# Patient Record
Sex: Male | Born: 1960
Health system: Southern US, Community
[De-identification: ages and names within clinical notes are randomized; demographics above are authoritative.]

## PROBLEM LIST (undated history)

## (undated) DIAGNOSIS — M545 Low back pain, unspecified: Secondary | ICD-10-CM

## (undated) DIAGNOSIS — L03115 Cellulitis of right lower limb: Secondary | ICD-10-CM

## (undated) DIAGNOSIS — Z8489 Family history of other specified conditions: Secondary | ICD-10-CM

## (undated) DIAGNOSIS — Z9289 Personal history of other medical treatment: Secondary | ICD-10-CM

## (undated) DIAGNOSIS — I82409 Acute embolism and thrombosis of unspecified deep veins of unspecified lower extremity: Secondary | ICD-10-CM

## (undated) DIAGNOSIS — K219 Gastro-esophageal reflux disease without esophagitis: Secondary | ICD-10-CM

## (undated) DIAGNOSIS — G8929 Other chronic pain: Secondary | ICD-10-CM

## (undated) DIAGNOSIS — M1990 Unspecified osteoarthritis, unspecified site: Secondary | ICD-10-CM

## (undated) DIAGNOSIS — F419 Anxiety disorder, unspecified: Secondary | ICD-10-CM

## (undated) HISTORY — PX: EXCISIONAL TOTAL KNEE ARTHROPLASTY WITH ANTIBIOTIC SPACERS: SHX5827

## (undated) HISTORY — PX: APPENDECTOMY: SHX54

## (undated) HISTORY — PX: JOINT REPLACEMENT: SHX530

## (undated) HISTORY — PX: TUMOR EXCISION: SHX421

## (undated) HISTORY — PX: I & D KNEE WITH POLY EXCHANGE: SHX5024

## (undated) HISTORY — PX: TOTAL KNEE ARTHROPLASTY: SHX125

## (undated) HISTORY — PX: KNEE JOINT MANIPULATION: SHX386

---

## 2011-08-30 DIAGNOSIS — M25579 Pain in unspecified ankle and joints of unspecified foot: Secondary | ICD-10-CM | POA: Insufficient documentation

## 2012-01-19 HISTORY — PX: TOTAL KNEE ARTHROPLASTY: SHX125

## 2013-03-12 DIAGNOSIS — L409 Psoriasis, unspecified: Secondary | ICD-10-CM | POA: Insufficient documentation

## 2013-09-13 DIAGNOSIS — G4700 Insomnia, unspecified: Secondary | ICD-10-CM | POA: Insufficient documentation

## 2013-10-17 DIAGNOSIS — T8450XA Infection and inflammatory reaction due to unspecified internal joint prosthesis, initial encounter: Secondary | ICD-10-CM | POA: Insufficient documentation

## 2014-10-10 LAB — HM COLONOSCOPY

## 2015-01-30 DIAGNOSIS — M1990 Unspecified osteoarthritis, unspecified site: Secondary | ICD-10-CM | POA: Insufficient documentation

## 2015-01-30 DIAGNOSIS — E559 Vitamin D deficiency, unspecified: Secondary | ICD-10-CM | POA: Insufficient documentation

## 2015-01-30 DIAGNOSIS — F419 Anxiety disorder, unspecified: Secondary | ICD-10-CM | POA: Insufficient documentation

## 2015-01-30 DIAGNOSIS — K219 Gastro-esophageal reflux disease without esophagitis: Secondary | ICD-10-CM | POA: Insufficient documentation

## 2015-01-30 DIAGNOSIS — E291 Testicular hypofunction: Secondary | ICD-10-CM | POA: Insufficient documentation

## 2015-01-30 DIAGNOSIS — R030 Elevated blood-pressure reading, without diagnosis of hypertension: Secondary | ICD-10-CM | POA: Insufficient documentation

## 2015-01-30 DIAGNOSIS — E7800 Pure hypercholesterolemia, unspecified: Secondary | ICD-10-CM | POA: Insufficient documentation

## 2015-07-09 DIAGNOSIS — Z96653 Presence of artificial knee joint, bilateral: Secondary | ICD-10-CM | POA: Insufficient documentation

## 2015-11-12 DIAGNOSIS — G8929 Other chronic pain: Secondary | ICD-10-CM | POA: Insufficient documentation

## 2015-11-12 DIAGNOSIS — M545 Low back pain: Secondary | ICD-10-CM

## 2017-01-18 HISTORY — PX: EXCISIONAL TOTAL KNEE ARTHROPLASTY WITH ANTIBIOTIC SPACERS: SHX5827

## 2017-02-17 DIAGNOSIS — M47816 Spondylosis without myelopathy or radiculopathy, lumbar region: Secondary | ICD-10-CM | POA: Insufficient documentation

## 2017-02-23 DIAGNOSIS — T8459XA Infection and inflammatory reaction due to other internal joint prosthesis, initial encounter: Secondary | ICD-10-CM | POA: Insufficient documentation

## 2017-02-23 DIAGNOSIS — Z96659 Presence of unspecified artificial knee joint: Secondary | ICD-10-CM

## 2017-03-07 DIAGNOSIS — Z6841 Body Mass Index (BMI) 40.0 and over, adult: Secondary | ICD-10-CM

## 2017-03-07 DIAGNOSIS — Z79899 Other long term (current) drug therapy: Secondary | ICD-10-CM | POA: Insufficient documentation

## 2017-04-18 HISTORY — PX: KNEE FUSION: SUR623

## 2017-05-16 DIAGNOSIS — T84019A Broken internal joint prosthesis, unspecified site, initial encounter: Secondary | ICD-10-CM | POA: Insufficient documentation

## 2017-06-18 DIAGNOSIS — L03115 Cellulitis of right lower limb: Secondary | ICD-10-CM

## 2017-06-18 HISTORY — DX: Cellulitis of right lower limb: L03.115

## 2017-07-05 ENCOUNTER — Inpatient Hospital Stay (HOSPITAL_COMMUNITY)
Admission: EM | Admit: 2017-07-05 | Discharge: 2017-07-07 | DRG: 863 | Disposition: A | Discharge status: Home Health | Payer: Medicare Other | Attending: Family Medicine | Admitting: Family Medicine

## 2017-07-05 ENCOUNTER — Emergency Department (HOSPITAL_COMMUNITY)
Admit: 2017-07-05 | Discharge: 2017-07-05 | Disposition: A | Discharge status: Home / Self Care | Payer: Medicare Other | Attending: Emergency Medicine | Admitting: Emergency Medicine

## 2017-07-05 ENCOUNTER — Encounter (HOSPITAL_COMMUNITY): Payer: Self-pay | Admitting: *Deleted

## 2017-07-05 ENCOUNTER — Other Ambulatory Visit: Payer: Self-pay

## 2017-07-05 ENCOUNTER — Inpatient Hospital Stay (HOSPITAL_COMMUNITY): Payer: Medicare Other

## 2017-07-05 DIAGNOSIS — G8929 Other chronic pain: Secondary | ICD-10-CM | POA: Diagnosis present

## 2017-07-05 DIAGNOSIS — M79604 Pain in right leg: Secondary | ICD-10-CM | POA: Diagnosis not present

## 2017-07-05 DIAGNOSIS — Z881 Allergy status to other antibiotic agents status: Secondary | ICD-10-CM

## 2017-07-05 DIAGNOSIS — Y838 Other surgical procedures as the cause of abnormal reaction of the patient, or of later complication, without mention of misadventure at the time of the procedure: Secondary | ICD-10-CM | POA: Diagnosis present

## 2017-07-05 DIAGNOSIS — M7989 Other specified soft tissue disorders: Secondary | ICD-10-CM

## 2017-07-05 DIAGNOSIS — Z7901 Long term (current) use of anticoagulants: Secondary | ICD-10-CM

## 2017-07-05 DIAGNOSIS — R000 Tachycardia, unspecified: Secondary | ICD-10-CM | POA: Diagnosis present

## 2017-07-05 DIAGNOSIS — K219 Gastro-esophageal reflux disease without esophagitis: Secondary | ICD-10-CM | POA: Diagnosis present

## 2017-07-05 DIAGNOSIS — B999 Unspecified infectious disease: Secondary | ICD-10-CM

## 2017-07-05 DIAGNOSIS — Z6841 Body Mass Index (BMI) 40.0 and over, adult: Secondary | ICD-10-CM | POA: Diagnosis not present

## 2017-07-05 DIAGNOSIS — M79609 Pain in unspecified limb: Secondary | ICD-10-CM

## 2017-07-05 DIAGNOSIS — L03115 Cellulitis of right lower limb: Secondary | ICD-10-CM | POA: Diagnosis present

## 2017-07-05 DIAGNOSIS — Z96651 Presence of right artificial knee joint: Secondary | ICD-10-CM | POA: Diagnosis present

## 2017-07-05 DIAGNOSIS — T8140XA Infection following a procedure, unspecified, initial encounter: Secondary | ICD-10-CM | POA: Diagnosis not present

## 2017-07-05 DIAGNOSIS — L0390 Cellulitis, unspecified: Secondary | ICD-10-CM | POA: Diagnosis not present

## 2017-07-05 DIAGNOSIS — Z79899 Other long term (current) drug therapy: Secondary | ICD-10-CM | POA: Diagnosis not present

## 2017-07-05 DIAGNOSIS — Z86718 Personal history of other venous thrombosis and embolism: Secondary | ICD-10-CM | POA: Diagnosis not present

## 2017-07-05 DIAGNOSIS — M545 Low back pain: Secondary | ICD-10-CM | POA: Diagnosis present

## 2017-07-05 DIAGNOSIS — E669 Obesity, unspecified: Secondary | ICD-10-CM | POA: Diagnosis present

## 2017-07-05 DIAGNOSIS — F419 Anxiety disorder, unspecified: Secondary | ICD-10-CM | POA: Diagnosis present

## 2017-07-05 HISTORY — DX: Acute embolism and thrombosis of unspecified deep veins of unspecified lower extremity: I82.409

## 2017-07-05 LAB — COMPREHENSIVE METABOLIC PANEL
ALT: 14 U/L — ABNORMAL LOW (ref 17–63)
AST: 14 U/L — ABNORMAL LOW (ref 15–41)
Albumin: 3.5 g/dL (ref 3.5–5.0)
Alkaline Phosphatase: 92 U/L (ref 38–126)
Anion gap: 11 (ref 5–15)
BUN: 15 mg/dL (ref 6–20)
CHLORIDE: 102 mmol/L (ref 101–111)
CO2: 27 mmol/L (ref 22–32)
Calcium: 9.2 mg/dL (ref 8.9–10.3)
Creatinine, Ser: 1.12 mg/dL (ref 0.61–1.24)
Glucose, Bld: 110 mg/dL — ABNORMAL HIGH (ref 65–99)
POTASSIUM: 3.4 mmol/L — AB (ref 3.5–5.1)
SODIUM: 140 mmol/L (ref 135–145)
Total Bilirubin: 0.4 mg/dL (ref 0.3–1.2)
Total Protein: 7.7 g/dL (ref 6.5–8.1)

## 2017-07-05 LAB — CBC WITH DIFFERENTIAL/PLATELET
ABS IMMATURE GRANULOCYTES: 0.1 10*3/uL (ref 0.0–0.1)
Basophils Absolute: 0.1 10*3/uL (ref 0.0–0.1)
Basophils Relative: 1 %
Eosinophils Absolute: 0.3 10*3/uL (ref 0.0–0.7)
Eosinophils Relative: 3 %
HEMATOCRIT: 33.6 % — AB (ref 39.0–52.0)
HEMOGLOBIN: 10.4 g/dL — AB (ref 13.0–17.0)
IMMATURE GRANULOCYTES: 1 %
LYMPHS ABS: 1.6 10*3/uL (ref 0.7–4.0)
LYMPHS PCT: 15 %
MCH: 26.4 pg (ref 26.0–34.0)
MCHC: 31 g/dL (ref 30.0–36.0)
MCV: 85.3 fL (ref 78.0–100.0)
Monocytes Absolute: 1.5 10*3/uL — ABNORMAL HIGH (ref 0.1–1.0)
Monocytes Relative: 15 %
NEUTROS PCT: 67 %
Neutro Abs: 6.9 10*3/uL (ref 1.7–7.7)
Platelets: 318 10*3/uL (ref 150–400)
RBC: 3.94 MIL/uL — AB (ref 4.22–5.81)
RDW: 15.2 % (ref 11.5–15.5)
WBC: 10.3 10*3/uL (ref 4.0–10.5)

## 2017-07-05 LAB — PROTIME-INR
INR: 2.46
PROTHROMBIN TIME: 26.5 s — AB (ref 11.4–15.2)

## 2017-07-05 LAB — URINALYSIS, ROUTINE W REFLEX MICROSCOPIC
BILIRUBIN URINE: NEGATIVE
Glucose, UA: NEGATIVE mg/dL
KETONES UR: NEGATIVE mg/dL
LEUKOCYTES UA: NEGATIVE
NITRITE: NEGATIVE
PH: 5 (ref 5.0–8.0)
Protein, ur: 30 mg/dL — AB
Specific Gravity, Urine: 1.028 (ref 1.005–1.030)

## 2017-07-05 LAB — I-STAT CG4 LACTIC ACID, ED: LACTIC ACID, VENOUS: 1.5 mmol/L (ref 0.5–1.9)

## 2017-07-05 MED ORDER — CYCLOBENZAPRINE HCL 5 MG PO TABS
5.0000 mg | ORAL_TABLET | Freq: Three times a day (TID) | ORAL | Status: DC | PRN
  Administered 2017-07-05 – 2017-07-06 (×2): 5 mg via ORAL
  Filled 2017-07-05 (×2): qty 1

## 2017-07-05 MED ORDER — ACETAMINOPHEN 500 MG PO TABS
500.0000 mg | ORAL_TABLET | Freq: Four times a day (QID) | ORAL | Status: DC | PRN
Start: 2017-07-05 — End: 2017-07-07
  Administered 2017-07-06 (×2): 500 mg via ORAL
  Filled 2017-07-05 (×2): qty 1

## 2017-07-05 MED ORDER — VANCOMYCIN HCL IN DEXTROSE 1-5 GM/200ML-% IV SOLN
1000.0000 mg | Freq: Two times a day (BID) | INTRAVENOUS | Status: DC
  Filled 2017-07-05: qty 200

## 2017-07-05 MED ORDER — VANCOMYCIN HCL IN DEXTROSE 1-5 GM/200ML-% IV SOLN: 1000.0000 mg | Freq: Once | INTRAVENOUS | Status: DC

## 2017-07-05 MED ORDER — GABAPENTIN 300 MG PO CAPS
300.0000 mg | ORAL_CAPSULE | Freq: Three times a day (TID) | ORAL | Status: DC
  Administered 2017-07-05 – 2017-07-07 (×7): 300 mg via ORAL
  Filled 2017-07-05 (×8): qty 1

## 2017-07-05 MED ORDER — CLINDAMYCIN PHOSPHATE 600 MG/50ML IV SOLN
600.0000 mg | Freq: Once | INTRAVENOUS | Status: AC
  Administered 2017-07-05: 600 mg via INTRAVENOUS
  Filled 2017-07-05: qty 50

## 2017-07-05 MED ORDER — VANCOMYCIN HCL 10 G IV SOLR
1250.0000 mg | Freq: Two times a day (BID) | INTRAVENOUS | Status: DC
  Administered 2017-07-06 – 2017-07-07 (×3): 1250 mg via INTRAVENOUS
  Filled 2017-07-05 (×3): qty 1250

## 2017-07-05 MED ORDER — WARFARIN SODIUM 10 MG PO TABS
10.0000 mg | ORAL_TABLET | Freq: Once | ORAL | Status: AC
  Administered 2017-07-05: 10 mg via ORAL
  Filled 2017-07-05 (×2): qty 1

## 2017-07-05 MED ORDER — PANTOPRAZOLE SODIUM 40 MG PO TBEC
40.0000 mg | DELAYED_RELEASE_TABLET | Freq: Every day | ORAL | Status: DC
  Administered 2017-07-06 – 2017-07-07 (×2): 40 mg via ORAL
  Filled 2017-07-05 (×2): qty 1

## 2017-07-05 MED ORDER — ACETAMINOPHEN 500 MG PO TABS
500.0000 mg | ORAL_TABLET | Freq: Once | ORAL | Status: AC
  Administered 2017-07-05: 500 mg via ORAL
  Filled 2017-07-05: qty 1

## 2017-07-05 MED ORDER — BUSPIRONE HCL 15 MG PO TABS
15.0000 mg | ORAL_TABLET | Freq: Two times a day (BID) | ORAL | Status: DC
  Administered 2017-07-05 – 2017-07-07 (×4): 15 mg via ORAL
  Filled 2017-07-05 (×5): qty 1

## 2017-07-05 MED ORDER — SODIUM CHLORIDE 0.9 % IV BOLUS
1000.0000 mL | Freq: Once | INTRAVENOUS | Status: AC
  Administered 2017-07-05: 1000 mL via INTRAVENOUS

## 2017-07-05 MED ORDER — VANCOMYCIN HCL 10 G IV SOLR
2500.0000 mg | Freq: Once | INTRAVENOUS | Status: AC
  Administered 2017-07-05: 2500 mg via INTRAVENOUS
  Filled 2017-07-05: qty 2500

## 2017-07-05 MED ORDER — WARFARIN - PHARMACIST DOSING INPATIENT
Freq: Every day | Status: DC
  Administered 2017-07-05 – 2017-07-07 (×3)

## 2017-07-05 MED ORDER — CYCLOBENZAPRINE HCL 10 MG PO TABS
5.0000 mg | ORAL_TABLET | Freq: Once | ORAL | Status: AC
  Administered 2017-07-05: 5 mg via ORAL
  Filled 2017-07-05: qty 1

## 2017-07-05 NOTE — ED Notes (Signed)
Admitting MDs at bedside.

## 2017-07-05 NOTE — ED Notes (Signed)
Patient transported to Ultrasound 

## 2017-07-05 NOTE — ED Notes (Signed)
Patient back in room after having ultrasound procedure.

## 2017-07-05 NOTE — ED Triage Notes (Signed)
Pt in c/o possible post op problem to his right knee, pt had a knee fusion completed on April 18th, pt is down visiting his daughter and had a home health nurse evaluating him at home, today his home health nurse was concerned about the redness noted to wound and increased drainage. Redness noted down his shin and up to the middle of his thigh which is new

## 2017-07-05 NOTE — ED Provider Notes (Addendum)
[facility] Provider Note   CSN: [id number] Arrival date & time: 07/05/17  1212     History   Chief Complaint Chief Complaint  Patient presents with  . Post-op Problem    HPI Randy Figueroa Randy Figueroa is a 57 y.o. male.  57 year old male presents with complaint of right leg redness.  Patient reports complex history involving his right knee.  Patient states that he had a right knee replacement 5 years ago which became infected and has had multiple surgeries since that time.  Most recently patient had a right knee fusion involving a rod from his hip to ankle on 05/05/17.  Patient was last seen by his surgeon on Wednesday, had staples removed at that time, persistent serous drainage from the wound and was advised to keep a dressing over the area.  Patient noticed redness developing approximately 3 days ago, progressively worsening. Patient is on Coumadin, states his INR has been low lately, seen by home health nurse today and INR was 3.5, sent to the ER for concern for increase in redness. [place], rode in the back seat of the car recently x 16 hour drive, reports multiple stops along the way. Denies chest pain, dizziness, SHOB. States his HR is normally elevated around 100 and was checked in traige after ambulating in on crutches and is feeling stressed. Reports chronic swelling to the leg, states swelling is slightly worse than usual, has baseline redness but is more red than usual. No changes in the normal drainage from the knee (normal color/normal smell/normal amount of drainage.      Past Medical History:  Diagnosis Date  . Anxiety   . Arthritis    "knees" (07/19/2017)  . Cellulitis of right leg 06/2017  . Chronic lower back pain    "L5-S1; I've had injections"  . DVT (deep venous thrombosis) ([place]) ~ 04/2017   RUE "when PICC was removed"  . Family history of adverse reaction to anesthesia    "mother has hard time coming out of it;  gets PONV" (07/19/2017)  . GERD (gastroesophageal reflux disease)   . History of blood transfusion    "I believe I have had one; related to one of the knee ORs" (07/19/2017)    Patient Active Problem List   Diagnosis Date Noted  . 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 Cellulitis 07/05/2017  . Mechanical failure of prosthetic joint ([place]) 05/16/2017  . Morbid obesity with BMI of 45.0-49.9, adult ([place]) 03/07/2017  . Medical marijuana use 03/07/2017  . 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 Lumbar spondylosis 02/17/2017  . Chronic low back pain 11/12/2015  . 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 Vitamin D deficiency 01/30/2015  . 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 Osteoarthritis 01/30/2015  . Hypercholesterolemia 01/30/2015  . Gastroesophageal reflux disease without esophagitis 01/30/2015  . Elevated blood-pressure reading without diagnosis of hypertension 01/30/2015  . Anxiety 01/30/2015  . Infection or inflammatory reaction due to internal joint prosthesis ([place]) 10/17/2013  . Insomnia 09/13/2013  . Psoriasis 03/12/2013  . Ankle pain 08/30/2011    Past Surgical History:  Procedure Laterality Date  . APPENDECTOMY  ~ 1976  . EXCISIONAL TOTAL KNEE ARTHROPLASTY WITH ANTIBIOTIC SPACERS Right   . EXCISIONAL TOTAL KNEE ARTHROPLASTY WITH ANTIBIOTIC SPACERS Right   . EXCISIONAL TOTAL KNEE ARTHROPLASTY  WITH ANTIBIOTIC SPACERS Right 01/2017  . I&D KNEE WITH POLY EXCHANGE Right   . JOINT REPLACEMENT    . KNEE FUSION Right 04/2017   "nail from hip to ankle"  . KNEE JOINT MANIPULATION Right   . TOTAL KNEE ARTHROPLASTY Bilateral 2014  . TOTAL KNEE ARTHROPLASTY Right   . TOTAL KNEE ARTHROPLASTY Right   . TUMOR EXCISION Right 1980s   "benign; back of my leg"        Home Medications    Prior to Admission medications     Medication Sig Start Date End Date Taking? Authorizing Provider  acetaminophen (TYLENOL) 500 MG tablet Take 500 mg by mouth every 6 (six) hours as needed.   Yes [provider]  EPINEPHrine (EPIPEN 2-PAK) 0.3 mg/0.3 mL IJ SOAJ injection Inject 1 pen into the muscle daily as needed for anaphylaxis.   Yes [provider]  oxyCODONE (OXY IR/ROXICODONE) 5 MG immediate release tablet Take 5-10 mg by mouth every 4 (four) hours as needed for pain. 06/06/17  Yes [provider]  pantoprazole (PROTONIX) 40 MG tablet Take 40 mg by mouth daily. 06/21/17  Yes [provider]  busPIRone (BUSPAR) 7.5 MG tablet Take 2 tablets (15 mg total) by mouth 2 (two) times daily. 08/01/17   Brunetta Jeans, PA-C  cefTRIAXone (ROCEPHIN) 2 g injection Administer 2 gram IV push over 10 min every 24 hrs as directed    [provider]  cyclobenzaprine (FLEXERIL) 5 MG tablet Take 1 tablet (5 mg total) by mouth 3 (three) times daily as needed for muscle spasms. 08/01/17   Brunetta Jeans, PA-C  gabapentin (NEURONTIN) 300 MG capsule Take 1 capsule (300 mg total) by mouth 3 (three) times daily. 08/01/17   Brunetta Jeans, PA-C  traMADol (ULTRAM) 50 MG tablet Take 1 tablet by mouth 2 (two) times daily.    [provider]  vancomycin IVPB Inject 1,500 mg into the vein every 12 (twelve) hours. Indication: prosthetic joint infection (knee) Last Day of Therapy:  08/30/17 Labs - Sunday/Monday:  CBC/D, BMP, and vancomycin trough. Labs - Thursday:  BMP and vancomycin trough Labs - Every other week:  ESR and CRP 07/19/17 08/30/17  Lind Covert, MD  warfarin (COUMADIN) 5 MG tablet Take 1 tablet by mouth daily.    [provider]    Family History History reviewed. No pertinent family history.  Social History Social History   Tobacco Use  . Smoking status: Former Smoker    Packs/day: 1.00    Years: 20.00    Pack years: 20.00    Types: Cigarettes    Last attempt  to quit: 1998    Years since quitting: 21.5  . Smokeless tobacco: Never Used  Substance Use Topics  . Alcohol use: Not Currently  . Drug use: Never     Allergies   Levofloxacin and Amoxicillin   Review of Systems Review of Systems  Constitutional: Negative for fever.  Respiratory: Negative for shortness of breath.   Musculoskeletal: Positive for gait problem.  Skin: Positive for color change.  Allergic/Immunologic: Negative for immunocompromised state.  Hematological: Negative for adenopathy.  Psychiatric/Behavioral: Negative for confusion.  All other systems reviewed and are negative.    Physical Exam Updated Vital Signs BP 130/90 (BP Location: Right Arm)   Pulse (!) 101   Temp 99.3 F (37.4 C) (Oral)   Resp 19   Ht 5' 5.98" (1.676 m)   Wt 126.1 kg (278 lb)   SpO2 96%  BMI 44.89 kg/m   Physical Exam  Constitutional: He is oriented to person, place, and time. He appears well-developed and well-nourished.  HENT:  Head: Normocephalic and atraumatic.  Cardiovascular: Intact distal pulses.  Pulmonary/Chest: Effort normal.  Musculoskeletal: He exhibits no deformity.  Neurological: He is alert and oriented to person, place, and time.  Skin: Skin is warm and dry. There is erythema.     Psychiatric: He has a normal mood and affect. His behavior is normal.  Nursing note and vitals reviewed.    ED Treatments / Results  Labs (all labs ordered are listed, but only abnormal results are displayed) Labs Reviewed  COMPREHENSIVE METABOLIC PANEL - Abnormal; Notable for the following components:      Result Value   Potassium 3.4 (*)    Glucose, Bld 110 (*)    AST 14 (*)    ALT 14 (*)    All other components within normal limits  CBC WITH DIFFERENTIAL/PLATELET - Abnormal; Notable for the following components:   RBC 3.94 (*)    Hemoglobin 10.4 (*)    HCT 33.6 (*)    Monocytes Absolute 1.5 (*)    All other components within normal limits  URINALYSIS, ROUTINE W  REFLEX MICROSCOPIC - Abnormal; Notable for the following components:   Hgb urine dipstick MODERATE (*)    Protein, ur 30 (*)    Bacteria, UA RARE (*)    All other components within normal limits  PROTIME-INR - Abnormal; Notable for the following components:   Prothrombin Time 26.5 (*)    All other components within normal limits  BASIC METABOLIC PANEL - Abnormal; Notable for the following components:   Glucose, Bld 114 (*)    Calcium 8.7 (*)    All other components within normal limits  CBC - Abnormal; Notable for the following components:   RBC 3.69 (*)    Hemoglobin 9.5 (*)    HCT 31.2 (*)    MCH 25.7 (*)    All other components within normal limits  PROTIME-INR - Abnormal; Notable for the following components:   Prothrombin Time 28.5 (*)    All other components within normal limits  CBC - Abnormal; Notable for the following components:   RBC 4.14 (*)    Hemoglobin 10.7 (*)    HCT 35.1 (*)    MCH 25.8 (*)    All other components within normal limits  PROTIME-INR - Abnormal; Notable for the following components:   Prothrombin Time 25.5 (*)    All other components within normal limits  BASIC METABOLIC PANEL - Abnormal; Notable for the following components:   Glucose, Bld 106 (*)    All other components within normal limits  VANCOMYCIN, TROUGH - Abnormal; Notable for the following components:   Vancomycin Tr 12 (*)    All other components within normal limits  HIV ANTIBODY (ROUTINE TESTING)  I-STAT CG4 LACTIC ACID, ED    EKG None  Radiology No results found.  Procedures Procedures (including critical care time)  Medications Ordered in ED Medications  sodium chloride 0.9 % bolus 1,000 mL (0 mLs Intravenous Stopped 07/05/17 1501)  clindamycin (CLEOCIN) IVPB 600 mg (0 mg Intravenous Stopped 07/05/17 1548)  cyclobenzaprine (FLEXERIL) tablet 5 mg (5 mg Oral Given 07/05/17 1520)  acetaminophen (TYLENOL) tablet 500 mg (500 mg Oral Given 07/05/17 1520)  vancomycin (VANCOCIN)  2,500 mg in sodium chloride 0.9 % 500 mL IVPB (2,500 mg Intravenous New Bag/Given 07/05/17 1828)  warfarin (COUMADIN) tablet 10 mg (10 mg Oral  Given 07/05/17 1840)  warfarin (COUMADIN) tablet 15 mg (15 mg Oral Given 07/06/17 1739)  traMADol (ULTRAM) tablet 50 mg (50 mg Oral Given 07/06/17 1429)  warfarin (COUMADIN) tablet 15 mg (15 mg Oral Given 07/07/17 1710)     Initial Impression / Assessment and Plan / ED Course  I have reviewed the triage vital signs and the nursing notes.  Pertinent labs & imaging results that were available during my care of the patient were reviewed by me and considered in my medical decision making (see chart for details).  Clinical Course as of Aug 05 936  Tue Jul 05, 1769  4346 57 year old male presents with concern for right leg redness.  Patient has had complications with infection in his right knee since right knee replacement 5 years ago, had right knee effusion in April 2019.  It is visiting from [facility], home health nurse visited today to check his knee and check an INR and was concerned about the degree of redness in the knee.  Patient states knee is always swollen, may be slightly more so now.  States that his INR was subtherapeutic recently and notes that he did take a extended drive from [place] to [place] although states he stopped multiple times to get out of the car. On exam, erythema to anterior and medial right knee, not circumferential. Small area of serous drainage (staples removed 1 week ago). Doppler study is negative for DVT.  INR is therapeutic at 2.46, CBC with normal white blood cell count at 10.3, CMP shows normal renal function.  She was given clindamycin in the ER awaiting work-up.  Patient was seen by Dr. Vanita Panda, results with patient and family, patient is agreeable to stay in the hospital for further antibiotics.   [LM]  1609 Case discussed with Dr. Kris Mouton, hospitalist for unassigned patient's who will see the patient.     [LM]    Clinical Course User Index [LM] Tacy Learn, PA-C     Final Clinical Impressions(s) / ED Diagnoses   Final diagnoses:  Infection  Cellulitis    ED Discharge Orders        Ordered    cephALEXin (KEFLEX) 500 MG capsule  Every 12 hours,   Status:  Discontinued     07/07/17 1636    doxycycline (VIBRA-TABS) 100 MG tablet  Every 12 hours,   Status:  Discontinued     07/07/17 [place], Annise Boran A, PA-C 07/05/17 1610    Tacy Learn, PA-C 07/05/17 1612    Carmin Muskrat, MD 07/07/17 2145    Tacy Learn, PA-C 08/05/17 8675    Carmin Muskrat, MD 08/05/17 [phone]

## 2017-07-05 NOTE — ED Notes (Signed)
Family Med paged to MadillAlyssa PA @ [phone] - for an unassigned consult. Verbal order from Dr. Jeraldine LootsLockwood.

## 2017-07-05 NOTE — Progress Notes (Signed)
ANTICOAGULATION CONSULT NOTE - Initial Consult  Pharmacy Consult for Warfarin Indication: DVT  Allergies  Allergen Reactions  . Amoxicillin Swelling    Has patient had a PCN reaction causing immediate rash, facial/tongue/throat swelling, SOB or lightheadedness with hypotension: Yes Has patient had a PCN reaction causing severe rash involving mucus membranes or skin necrosis: No Has patient had a PCN reaction that required hospitalization: No Has patient had a PCN reaction occurring within the last 10 years: No' If all of the above answers are "NO", then may proceed with Cephalosporin use.     Patient Measurements: Height: 5' 5.98" (167.6 cm) Weight: 276 lb 0.3 oz (125.2 kg) IBW/kg (Calculated) : 63.76 Heparin Dosing Weight:   Vital Signs: Temp: 98.3 F (36.8 C) (06/18 1221) BP: 144/75 (06/18 1505) Pulse Rate: 103 (06/18 1505)  Labs: Recent Labs    07/05/17 1247  HGB 10.4*  HCT 33.6*  PLT 318  LABPROT 26.5*  INR 2.46  CREATININE 1.12    Estimated Creatinine Clearance: 93.2 mL/min (by C-G formula based on SCr of 1.12 mg/dL).   Medical History: Past Medical History:  Diagnosis Date  . DVT (deep venous thrombosis) (H[facility])      Assessment: Pt came on PTA Warfarin for VTE. Date therapy started 06/03/17 and expected duration until 09/03/17. INR today is therapeutic at 2.46  CBC stable, no bleeding reported.   Patient reported that his warfarin regimen has been changed. He is supposed to receive 10 mg tonight (6/18), 15 mg on 6/19 and 6/20, then re-evaluate on Friday.   Goal of Therapy:  INR 2-3 Monitor platelets by anticoagulation protocol: Yes   Plan:  Warfarin 10 mg po x 1 tonight Daily CBC and INR  Adline PotterSabrina Camrynn Mcclintic, PharmD Pharmacy Resident

## 2017-07-05 NOTE — Progress Notes (Signed)
New Admission Note:   Arrival Method: Bed  Mental Orientation: Alert and oriented  Telemetry: Assessment: Completed Skin: surgery scar on right knee and right hip  IV: vancomycin infusing  Pain: 0/10  Tubes: Safety Measures: Safety Fall Prevention Plan has been given, discussed and signed Admission: Completed [facility] Orientation: Patient has been orientated to the room, unit and staff.  Family: none present   Orders have been reviewed and implemented. Will continue to monitor the patient. Call light has been placed within reach and bed alarm has been activated.   Elijha Dedman RN [facility] Phone: [phone]

## 2017-07-05 NOTE — ED Triage Notes (Signed)
Pt also had a recent 13 hour car ride, stopped three times during that ride and walked for 15 minutes. Pt is currently taking coumadin and within therapeutic range.

## 2017-07-05 NOTE — H&P (Signed)
Family Medicine Teaching [facility] Admission History and Physical Service Pager: [phone]  Patient name: Randy Figueroa Medical record number: [id number] Date of birth: [birth year] Age: 57 y.o. Gender: male  Primary Care Provider: System, Pcp Not In Consultants: orthopedics Code Status: full  Chief Complaint: cellulitis  Assessment and Plan: [name] is a 57 y.o. male presenting with right leg cellulitis. PMH is significant for multiple right prosthetic knee infections, recent VTE, anxiety, GERD, and obesity.  Cellulitis, acute: Right leg swelling and erythema worsening since 6/16 with purulent drainage.  S/p one dose of clindamycin in the ED.  Complex history of multiple hardware infections of knee prosthetic with recent removal of prosthetic and placement of rod from hip to ankle in April 2019.  On exam, erythematous, tender to palpation region surrounding R knee and extending to thigh and calf, no fluctuance.  Purulent drainage visualized.  No systemic signs of infection, with no fever, normal WBC at 10.3, no lactic acidosis.  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  Will start antibiotics, consult orthopedics, and contact patient's home orthopedist to help guide management. - admit to med-surg, attending Dr. Jennette Kettle - start vancomycin, pharmacy to dose - consult orthopedic surgery - contact patient's orthopedist  - monitor margins of cellulitis - monitor fever curve - continue home gabapentin 300 mg TID PRN and tylenol 500 mg Q6H PRN for pain - am CBC - right femur and tib/fib xrays  R arm VTE: Patient takes warfarin at home for recent VTE in R arm thought to be caused by pressure of one of his crutches.  INR measured today by home health nurse and was 2.5. - continue warfarin, pharmacy to dose  Tachycardia, chronic: HR ranged from 103-120.  Along with patient's leg  erythema and recent travel, he was worked up for DVT with venous doppler, which was negative.  Tachycardia was found to be chronic during chart review.  Blood clots also less likely given therapeutic level of warfarin. - continue to monitor  Anxiety: Patient takes Buspar 15 mg BID at home. - continue home Buspar  GERD: Patient takes Protonix 40 mg at home - continue home protonix  Chronic low back pain:  Patient on flexeril 5 mg TID PRN, gabapentin 300 mg TID, and oxycodone 5 mg Q4H PRN. - hold oxycodone unless patient requests, can continue other medications  FEN/GI: regular diet Prophylaxis: therapeutic warfarin  Disposition: home  History of Present Illness:  Randy Figueroa is a 57 y.o. male presenting with right leg erythema, swelling, and tenderness that has been slowly developing for the past week, becoming more noticeable on Sunday, June 16.  Patient is color blind, so he struggles to see the red areas on his leg, which makes it difficult for him to track its progress.  He has a complex history of infected hardware after having a right knee replacement 5 years ago.  He has had multiple washouts and replacements with eventual removal of right knee prosthesis and placement of nail from hip to ankle in April 2019.  He has needed repeat antibiotic treatments with vancomycin in the past.  Review Of Systems: Per HPI with the following additions:  Review of Systems  Constitutional: Negative for chills, fever, malaise/fatigue and weight loss.  Respiratory: Negative for cough and shortness of breath.   Cardiovascular: Negative for chest pain and claudication.  Gastrointestinal: Negative  for abdominal pain, constipation, diarrhea, nausea and vomiting.  Musculoskeletal: Positive for back pain.  Skin: Positive for rash.  Neurological: Negative for headaches.    Patient Active Problem List   Diagnosis Date Noted  . Cellulitis 07/05/2017    Past Medical History: Past Medical History:   Diagnosis Date  . DVT (deep venous thrombosis) (HCC)     Past Surgical History: Past Surgical History:  Procedure Laterality Date  . KNEE SURGERY      Social History: Social History   Tobacco Use  . Smoking status: Never Smoker  . Smokeless tobacco: Never Used  Substance Use Topics  . Alcohol use: Not on file  . Drug use: Not on file   Additional social history:   Please also refer to relevant sections of EMR.  Family History: History reviewed. No pertinent family history. (If not completed, MUST add something in)  Allergies and Medications: Allergies  Allergen Reactions  . Amoxicillin Swelling   No current facility-administered medications on file prior to encounter.    No current outpatient medications on file prior to encounter.    Objective: BP (!) 144/75 (BP Location: Right Arm)   Pulse (!) 103   Temp 98.3 F (36.8 C)   Resp 16   SpO2 100%  Physical Exam  Constitutional: He is oriented to person, place, and time. He appears well-developed and well-nourished. No distress.  HENT:  Head: Normocephalic and atraumatic.  Eyes: Conjunctivae and EOM are normal.  Neck: Normal range of motion.  Cardiovascular: Regular rhythm and normal heart sounds.  Slightly tachycardic  Pulmonary/Chest: Effort normal and breath sounds normal. No respiratory distress.  Abdominal: Soft. Bowel sounds are normal. There is no tenderness.  Neurological: He is alert and oriented to person, place, and time.  Skin: Skin is warm and dry. There is erythema.  Erythema surrounding R knee, not circumferential.  No fluctuance.  Some purulent drainage.     Labs and Imaging: CBC BMET  Recent Labs  Lab 07/05/17 1247  WBC 10.3  HGB 10.4*  HCT 33.6*  PLT 318   Recent Labs  Lab 07/05/17 1247  NA 140  K 3.4*  CL 102  CO2 27  BUN 15  CREATININE 1.12  GLUCOSE 110*  CALCIUM 9.2     No results found.   Lennox SoldersWinfrey, Camreigh Michie C, MD 07/05/2017, 4:14 PM PGY-1, [facility] FPTS Intern pager: [phone], text pages welcome

## 2017-07-05 NOTE — Progress Notes (Signed)
Pharmacy Antibiotic Note  Collier FlowersDavid A St Alla Germanierre is a 57 y.o. male admitted on 07/05/2017 with cellulitis.  Pharmacy has been consulted for vancomycin dosing.  Patient came to ED with c/o of right leg redness for 3 days now status post-knee replacement 5 years ago with multiple subsequent surgeries. Most recently patient had a right knee fusion involving a rod from his hip to ankle on 05/05/17. Recent staple removal with serous drainage. Received a dose of clindamycin in the ED.   Lactic acid and WBC wnl, afebrile.  nCrcl ~75 ml/min   Plan: Vancomycin 2500 mg IV x 1 Vancomycin 1000 mg IV Q12h Monitor clinical progression, LOT and renal function Check VT as appropirate   Height: 5' 5.98" (167.6 cm) Weight: 276 lb 0.3 oz (125.2 kg) IBW/kg (Calculated) : 63.76  Temp (24hrs), Avg:98.3 F (36.8 C), Min:98.3 F (36.8 C), Max:98.3 F (36.8 C)  Recent Labs  Lab 07/05/17 1247 07/05/17 1255  WBC 10.3  --   CREATININE 1.12  --   LATICACIDVEN  --  1.50    Estimated Creatinine Clearance: 93.2 mL/min (by C-G formula based on SCr of 1.12 mg/dL).    Allergies  Allergen Reactions  . Amoxicillin Swelling    Has patient had a PCN reaction causing immediate rash, facial/tongue/throat swelling, SOB or lightheadedness with hypotension: Yes Has patient had a PCN reaction causing severe rash involving mucus membranes or skin necrosis: No Has patient had a PCN reaction that required hospitalization: No Has patient had a PCN reaction occurring within the last 10 years: No' If all of the above answers are "NO", then may proceed with Cephalosporin use.     Antimicrobials this admission: 6/18 vancomycin >>  6/18 clindamycin>> 6/18 (1 dose)  Dose adjustments this admission:   Microbiology results:   Adline PotterSabrina Cru Kritikos, PharmD Pharmacy Resident

## 2017-07-05 NOTE — Progress Notes (Signed)
Right lower extremity venous duplex has been completed. Negative for obvious evidence of DVT. Results were given to the patient's nurse, Marcelino DusterMichelle.  07/05/17 2:10 PM Olen CordialGreg Nyshaun Standage RVT

## 2017-07-06 LAB — BASIC METABOLIC PANEL
Anion gap: 7 (ref 5–15)
BUN: 13 mg/dL (ref 6–20)
CHLORIDE: 104 mmol/L (ref 101–111)
CO2: 28 mmol/L (ref 22–32)
Calcium: 8.7 mg/dL — ABNORMAL LOW (ref 8.9–10.3)
Creatinine, Ser: 0.99 mg/dL (ref 0.61–1.24)
GFR calc Af Amer: >60 mL/min (ref >60)
GFR calc non Af Amer: >60 mL/min (ref >60)
GLUCOSE: 114 mg/dL — AB (ref 65–99)
POTASSIUM: 3.6 mmol/L (ref 3.5–5.1)
Sodium: 139 mmol/L (ref 135–145)

## 2017-07-06 LAB — CBC
HEMATOCRIT: 31.2 % — AB (ref 39.0–52.0)
Hemoglobin: 9.5 g/dL — ABNORMAL LOW (ref 13.0–17.0)
MCH: 25.7 pg — ABNORMAL LOW (ref 26.0–34.0)
MCHC: 30.4 g/dL (ref 30.0–36.0)
MCV: 84.6 fL (ref 78.0–100.0)
Platelets: 281 10*3/uL (ref 150–400)
RBC: 3.69 MIL/uL — ABNORMAL LOW (ref 4.22–5.81)
RDW: 15.4 % (ref 11.5–15.5)
WBC: 8.7 10*3/uL (ref 4.0–10.5)

## 2017-07-06 LAB — PROTIME-INR
INR: 2.7
Prothrombin Time: 28.5 seconds — ABNORMAL HIGH (ref 11.4–15.2)

## 2017-07-06 MED ORDER — WARFARIN SODIUM 7.5 MG PO TABS
15.0000 mg | ORAL_TABLET | Freq: Once | ORAL | Status: AC
  Administered 2017-07-06: 15 mg via ORAL
  Filled 2017-07-06: qty 2

## 2017-07-06 MED ORDER — TRAMADOL HCL 50 MG PO TABS
50.0000 mg | ORAL_TABLET | Freq: Once | ORAL | Status: AC
  Administered 2017-07-06: 50 mg via ORAL
  Filled 2017-07-06: qty 1

## 2017-07-06 MED ORDER — CYCLOBENZAPRINE HCL 5 MG PO TABS
5.0000 mg | ORAL_TABLET | Freq: Three times a day (TID) | ORAL | Status: DC
  Administered 2017-07-06 – 2017-07-07 (×4): 5 mg via ORAL
  Filled 2017-07-06 (×5): qty 1

## 2017-07-06 MED ORDER — OXYCODONE HCL 5 MG PO TABS
5.0000 mg | ORAL_TABLET | Freq: Two times a day (BID) | ORAL | Status: DC | PRN
  Administered 2017-07-06: 5 mg via ORAL
  Filled 2017-07-06: qty 1

## 2017-07-06 NOTE — Discharge Summary (Signed)
[facility] Dis[facility] Summary  Patient name: Randy Figueroa Medical record number: [id number] Date of birth: [birth year] Age: 57 y.o. Gender: male Date of Admission: 07/05/2017  Date of Discharge: 6/20/2-19 Admitting Physician: Lennox Solders, MD  Primary Care Provider: System, Pcp Not In Consultants: Orthopedics  Indication for Hospitalization:   Right Leg Cellulitis  Discharge Diagnoses/Problem List:   Right Leg Cellulitis Right Arm VTE GERD Chronic Low Back Pain  Disposition: home  Discharge Condition: medically stable  Discharge Exam:   Gen: Alert and Oriented x 3, NAD HEENT: Normocephalic, atraumatic, PERRLA, EOMI CV: RRR, no murmurs, normal S1, S2 split, +2 pulses dorsalis pedis bilaterally Resp: CTAB, no wheezing, rales, or rhonchi, comfortable work of breathing Abd: non-distended, non-tender, soft, +bs in all four quadrants MSK: Moves all four extremities Ext: no clubbing, cyanosis, or edema Skin: warm, dry, intact, no rashes; large area of erythema that has reduced in size and mainly concentrated around his midline knee scar and proximal tibia area. Still warm to touch and remains swollen.  Brief Hospital Course:  [name]. Alla Figueroa is a 57y/o male with a PMH of multiple right prosthetic knee infections, recent VTE, anxiety, GERD, and obesity who presented to the hospital due to right leg pain, swelling, and skin changes. Due to his past history of multiple right knee infections s/p right knee total joint replacement he was placed on vancomycin on admission. His blood cultures were followed and were negative after 24 hours. He clinically improved and his WBC declined and he remained afebrile since admission. He was transitioned to oral Doxycycline and Keflex and monitored for tolerance. He was able to tolerate the oral antibiotics well and he was cleared for medical discharge with follow up in our office. He is visiting from [place].  Issues for Follow Up:  1. Please ensure Randy Figueroa is taking both Doxycycline and Keflex as directed and that he knows he must complete the full course of antibiotics. 2. Please ensure Randy Figueroa has proper follow up scheduled with his orthopedist in [place] when he returns home in 2 weeks.  Significant Procedures:   Significant Labs and Imaging:  Recent Labs  Lab 07/05/17 1247 07/06/17 0543  WBC 10.3 8.7  HGB 10.4* 9.5*  HCT 33.6* 31.2*  PLT 318 281   Recent Labs  Lab 07/05/17 1247 07/06/17 0543  NA 140 139  K 3.4* 3.6  CL 102 104  CO2 27 28  GLUCOSE 110* 114*  BUN 15 13  CREATININE 1.12 0.99  CALCIUM 9.2 8.7*  ALKPHOS 92  --   AST 14*  --   ALT 14*  --   ALBUMIN 3.5  --    PT/INR: 28.5/2.70  Imaging/Diagnostic Tests:  DG Right Tib/Fib IMPRESSION: 1. The distal right lower extremity intramedullary rod and interlocking hardware appears intact without lucency. 2. Chronic appearing bone destruction at the right knee with nonspecific dystrophic calcifications and absent arthrodesis as described on the femur series today.  DG Femur IMPRESSION: 1. Intramedullary rod in place from the right femur intertrochanteric segment distally into the right tibia appears intact without evidence of loosening. 2. No right knee joint arthrodesis suspected. Chronic appearing bone destruction about the knee with nonspecific dystrophic calcifications.   Results/Tests Pending at Time of Discharge: None  Discharge Medications:  Allergies as of 07/07/2017      Reactions   Levofloxacin Anaphylaxis   Per Care Everywhere   Amoxicillin Swelling   Has  patient had a PCN reaction causing immediate rash, facial/tongue/throat swelling, SOB or lightheadedness with hypotension: Yes Has patient had a PCN reaction causing severe rash involving mucus membranes or skin necrosis: No Has patient had a PCN reaction that required hospitalization: No Has patient had a  PCN reaction occurring within the last 10 years: No' If all of the above answers are "NO", then may proceed with Cephalosporin use.      Medication List    TAKE these medications   acetaminophen 500 MG tablet Commonly known as:  TYLENOL Take 500 mg by mouth every 6 (six) hours as needed.   busPIRone 7.5 MG tablet Commonly known as:  BUSPAR Take 15 mg by mouth 2 (two) times daily.   cephALEXin 500 MG capsule Commonly known as:  KEFLEX Take 1 capsule (500 mg total) by mouth every 12 (twelve) hours for 12 days.   cyclobenzaprine 5 MG tablet Commonly known as:  FLEXERIL Take 5 mg by mouth 3 (three) times daily as needed for muscle spasms.   doxycycline 100 MG tablet Commonly known as:  VIBRA-TABS Take 1 tablet (100 mg total) by mouth every 12 (twelve) hours for 12 days.   enoxaparin 40 MG/0.4ML injection Commonly known as:  LOVENOX Inject 0.4 mLs into the skin See admin instructions. NIGHTLY FOR 10 DAYS STOP TAKING WHEN INR REACHES 2.0   EPIPEN 2-PAK 0.3 mg/0.3 mL Soaj injection Generic drug:  EPINEPHrine Inject 1 pen into the muscle daily as needed for anaphylaxis.   gabapentin 300 MG capsule Commonly known as:  NEURONTIN Take 300 mg by mouth 3 (three) times daily.   oxyCODONE 5 MG immediate release tablet Commonly known as:  Oxy IR/ROXICODONE Take 5-10 mg by mouth every 4 (four) hours as needed for pain.   pantoprazole 40 MG tablet Commonly known as:  PROTONIX Take 40 mg by mouth daily.   warfarin 5 MG tablet Commonly known as:  COUMADIN Take 10-15 mg by mouth See admin instructions. Daily based on INR reading       Discharge Instructions: Please refer to Patient Instructions section of EMR for full details.  Patient was counseled important signs and symptoms that should prompt return to medical care, changes in medications, dietary instructions, activity restrictions, and follow up appointments.   Follow-Up Appointments:   Arlyce HarmanLockamy, Trissa Molina, DO 07/10/2017,  11:01 PM PGY-1, [facility]

## 2017-07-06 NOTE — Consult Note (Addendum)
Reason for Consult: R knee cellulitis  Referring Physician: Nuala Alpha, DO   HPI: Patient is a 57 year old male, with a PMH significant for multiple R knee infections and recent DVT, that presented to [facility] ED on 07/05/17 with concerns over increased redness, swelling and drainage from his right knee.  He is 4 weeks S/P R knee joint arthrodesis via IM nailing by Dr. Ernst Spell at [facility] following 2 failed R TKR, antibiotic spacers, and poly exchange.  Radiographs of the patient's R knee and hip were obtained which demonstrated appropriate alignment with no evidence of movement or failure of hardware.  The radiologist noted chronic appearing bone destruction, but no evidence of bony erosion consistent with osteomyelitis.  The patient was then admitted to the hospital under the [facility] service and placed on IV vanc.  CBC was obtained with no evidence of elevated WBC.  Dr. Wylene Simmer was then consulted for an orthopedic evaluation in order to determine if further surgery is medically necessary at this point.  The patient denies fever, chills, malodorous aroma, purulent drainage, N/V, CP, SOB.  He denies any pain.  The patient reports that he has had consistent clear yellowish drainage from his distal incision site ever since the staples were removed 2 weeks ago.  Dr. Ernst Spell recommended daily dry dressing changes.  The patient states that his drainage has been fairly consistent, but increased over the last 48-72 hours, along with increased redness and swelling along his knee.  The patient reports that his redness, swelling, and drainage have all significantly improved since starting the ABX. He is visiting his family here in [place], and has been [place] on his R LE prior to his arrival in [place].  He denies a history of diabetes, autoimmune disorders, cancers, and he is a nonsmoker.  He is currently on warfarin due to his recent DVT.  Dr. Ernst Spell has been made aware of the  patient's current medical condition.  Past Medical History:  Diagnosis Date  . DVT (deep venous thrombosis) ([place])     Past Surgical History:  Procedure Laterality Date  . KNEE SURGERY      History reviewed. No pertinent family history.  Social History:  reports that he has never smoked. He has never used smokeless tobacco. His alcohol and drug histories are not on file.  Allergies:  Allergies  Allergen Reactions  . Levofloxacin Anaphylaxis    Per Care Everywhere  . Amoxicillin Swelling    Has patient had a PCN reaction causing immediate rash, facial/tongue/throat swelling, SOB or lightheadedness with hypotension: Yes Has patient had a PCN reaction causing severe rash involving mucus membranes or skin necrosis: No Has patient had a PCN reaction that required hospitalization: No Has patient had a PCN reaction occurring within the last 10 years: No' If all of the above answers are "NO", then may proceed with Cephalosporin use.     Medications: I have reviewed the patient's current medications.  Results for orders placed or performed during the hospital encounter of 07/05/17 (from the past 48 hour(s))  Comprehensive metabolic panel     Status: Abnormal   Collection Time: 07/05/17 12:47 PM  Result Value Ref Range   Sodium 140 135 - 145 mmol/L   Potassium 3.4 (L) 3.5 - 5.1 mmol/L   Chloride 102 101 - 111 mmol/L   CO2 27 22 - 32 mmol/L   Glucose, Bld 110 (H) 65 - 99 mg/dL   BUN 15 6 - 20 mg/dL  Creatinine, Ser 1.12 0.61 - 1.24 mg/dL   Calcium 9.2 8.9 - 10.3 mg/dL   Total Protein 7.7 6.5 - 8.1 g/dL   Albumin 3.5 3.5 - 5.0 g/dL   AST 14 (L) 15 - 41 U/L   ALT 14 (L) 17 - 63 U/L   Alkaline Phosphatase 92 38 - 126 U/L   Total Bilirubin 0.4 0.3 - 1.2 mg/dL   GFR calc non Af Amer >60 >60 mL/min   GFR calc Af Amer >60 >60 mL/min    Comment: (NOTE) The eGFR has been calculated using the CKD EPI equation. This calculation has not been validated in all clinical  situations. eGFR's persistently <60 mL/min signify possible Chronic Kidney Disease.    Anion gap 11 5 - 15    Comment: Performed at [facility] [address], [place], [place] [address]  CBC with Differential     Status: Abnormal   Collection Time: 07/05/17 12:47 PM  Result Value Ref Range   WBC 10.3 4.0 - 10.5 K/uL   RBC 3.94 (L) 4.22 - 5.81 MIL/uL   Hemoglobin 10.4 (L) 13.0 - 17.0 g/dL   HCT 33.6 (L) 39.0 - 52.0 %   MCV 85.3 78.0 - 100.0 fL   MCH 26.4 26.0 - 34.0 pg   MCHC 31.0 30.0 - 36.0 g/dL   RDW 15.2 11.5 - 15.5 %   Platelets 318 150 - 400 K/uL   Neutrophils Relative % 67 %   Neutro Abs 6.9 1.7 - 7.7 K/uL   Lymphocytes Relative 15 %   Lymphs Abs 1.6 0.7 - 4.0 K/uL   Monocytes Relative 15 %   Monocytes Absolute 1.5 (H) 0.1 - 1.0 K/uL   Eosinophils Relative 3 %   Eosinophils Absolute 0.3 0.0 - 0.7 K/uL   Basophils Relative 1 %   Basophils Absolute 0.1 0.0 - 0.1 K/uL   Immature Granulocytes 1 %   Abs Immature Granulocytes 0.1 0.0 - 0.1 K/uL    Comment: Performed at [facility] [address] [place], [place] [address]  Protime-INR     Status: Abnormal   Collection Time: 07/05/17 12:47 PM  Result Value Ref Range   Prothrombin Time 26.5 (H) 11.4 - 15.2 seconds   INR 2.46     Comment: Performed at [facility] [address], [place], [place] [address]  I-Stat CG4 Lactic Acid, ED     Status: None   Collection Time: 07/05/17 12:55 PM  Result Value Ref Range   Lactic Acid, Venous 1.50 0.5 - 1.9 mmol/L  Urinalysis, Routine w reflex microscopic     Status: Abnormal   Collection Time: 07/05/17  3:10 PM  Result Value Ref Range   Color, Urine YELLOW YELLOW   APPearance CLEAR CLEAR   Specific Gravity, Urine 1.028 1.005 - 1.030   pH 5.0 5.0 - 8.0   Glucose, UA NEGATIVE NEGATIVE mg/dL   Hgb urine dipstick MODERATE (A) NEGATIVE   Bilirubin Urine NEGATIVE NEGATIVE   Ketones, ur NEGATIVE NEGATIVE mg/dL   Protein, ur 30 (A) NEGATIVE mg/dL   Nitrite  NEGATIVE NEGATIVE   Leukocytes, UA NEGATIVE NEGATIVE   RBC / HPF 11-20 0 - 5 RBC/hpf   WBC, UA 0-5 0 - 5 WBC/hpf   Bacteria, UA RARE (A) NONE SEEN   Mucus PRESENT     Comment: Performed at [facility], [address], [place], [place] [address]  Basic metabolic panel     Status: Abnormal  Collection Time: 07/06/17  5:43 AM  Result Value Ref Range   Sodium 139 135 - 145 mmol/L   Potassium 3.6 3.5 - 5.1 mmol/L   Chloride 104 101 - 111 mmol/L   CO2 28 22 - 32 mmol/L   Glucose, Bld 114 (H) 65 - 99 mg/dL   BUN 13 6 - 20 mg/dL   Creatinine, Ser 0.99 0.61 - 1.24 mg/dL   Calcium 8.7 (L) 8.9 - 10.3 mg/dL   GFR calc non Af Amer >60 >60 mL/min   GFR calc Af Amer >60 >60 mL/min    Comment: (NOTE) The eGFR has been calculated using the CKD EPI equation. This calculation has not been validated in all clinical situations. eGFR's persistently <60 mL/min signify possible Chronic Kidney Disease.    Anion gap 7 5 - 15    Comment: Performed at [facility] [address] [place], [place] [address]  CBC     Status: Abnormal   Collection Time: 07/06/17  5:43 AM  Result Value Ref Range   WBC 8.7 4.0 - 10.5 K/uL   RBC 3.69 (L) 4.22 - 5.81 MIL/uL   Hemoglobin 9.5 (L) 13.0 - 17.0 g/dL   HCT 31.2 (L) 39.0 - 52.0 %   MCV 84.6 78.0 - 100.0 fL   MCH 25.7 (L) 26.0 - 34.0 pg   MCHC 30.4 30.0 - 36.0 g/dL   RDW 15.4 11.5 - 15.5 %   Platelets 281 150 - 400 K/uL    Comment: Performed at [facility], [place] [address], [place], [place] [address]  Protime-INR     Status: Abnormal   Collection Time: 07/06/17  5:43 AM  Result Value Ref Range   Prothrombin Time 28.5 (H) 11.4 - 15.2 seconds   INR 2.70     Comment: Performed at [facility] [address] [place], [place] [address]    Dg Tibia/fibula Right  Result Date: 07/05/2017 CLINICAL DATA:  57 year old male status post surgery to the right lower extremity 4 months ago. Right knee infection x1 month. EXAM: RIGHT TIBIA AND  FIBULA - 2 VIEW COMPARISON:  Right femur series today reported separately. FINDINGS: The distal aspect of the right lower extremity intramedullary rod terminates at the distal tibia metadiaphysis with 2 interlocking cortical screws. The hardware appears intact without suspicious lucency. The right fibula appears intact. Right mortise joint alignment is preserved. Osseous irregularity about the eroded right knee joint as detailed on the femur series today. No soft tissue gas identified. IMPRESSION: 1. The distal right lower extremity intramedullary rod and interlocking hardware appears intact without lucency. 2. Chronic appearing bone destruction at the right knee with nonspecific dystrophic calcifications and absent arthrodesis as described on the femur series today. Electronically Signed   By: Genevie Ann M.D.   On: 07/05/2017 19:34   Dg Femur, Min 2 Views Right  Result Date: 07/05/2017 CLINICAL DATA:  57 year old male status post surgery to the right lower extremity 4 months ago. Right knee infection x1 month. EXAM: RIGHT FEMUR 2 VIEWS COMPARISON:  None. FINDINGS: An intramedullary rod is in place from the right femur intertrochanteric segment extending distally through the right knee and into the tibia. There are 2 proximal interlocking cortical screws. The proximal right femur and proximal hardware appear intact. The right hip joint appears normal. Intact visible pelvis. Chronic appearing destructive changes at the right knee joint with apparent bone packing material, and surrounding dystrophic soft tissue calcifications. No right knee arthrodesis suspected.  No suspicious lucency identified along the course of the intramedullary rod through to the proximal tibia. No acute fracture identified. No soft tissue gas identified. IMPRESSION: 1. Intramedullary rod in place from the right femur intertrochanteric segment distally into the right tibia appears intact without evidence of loosening. 2. No right knee joint  arthrodesis suspected. Chronic appearing bone destruction about the knee with nonspecific dystrophic calcifications. Electronically Signed   By: Genevie Ann M.D.   On: 07/05/2017 19:30    ROS:   Gen: Denies fever, chills, weight change, fatigue, night sweats MSK: Patient c/o of R knee swelling, redness, and drainage.  Denies pain. Neuro: Denies headache, numbness, weakness, slurred speech, loss of memory or consciousness Derm: Denies rash, dry skin, scaling or peeling skin change HEENT: Denies blurred vision, double vision, neck stiffness, dysphagia PULM: Denies shortness of breath, cough, sputum production, hemoptysis, wheezing CV: Denies chest pain, edema, orthopnea, palpitations GI: Denies abdominal pain, nausea, vomiting, diarrhea, hematochezia, melena, constipation  Endocrine: Denies hot or cold intolerance, polyuria, polyphagia or appetite change Heme: Denies easy bruising, bleeding, bleeding gums  PE:  General: WDWN patient in NAD. Psych:  Appropriate mood and affect. Neuro:  A&O x 3, Moving all extremities, sensation intact to light touch HEENT:  EOMs intact Chest:  Even non-labored respirations Skin:  Incision demonstrates appropriate healing at the proximal 2/3 of the incision site. Interval serous drainage is noted at the distal 1/3 of the incision site.  No malodorous aroma.  No fluctuance. Extremities: warm, moderate edema and erythema at the R knee.  No lymphadenopathy.  Non-tender to palpation. Pulses: Popliteus 2+ MSK:  ROM: R knee fixed at TKE due to attempted arthrodesis, MMT: able to perform quad set, (-) Homan's  Blood pressure 121/68, pulse 83, temperature 99.4 F (37.4 C), temperature source Oral, resp. rate 18, height 5' 5.98" (1.676 m), weight 126.1 kg (278 lb), SpO2 96 %.   Assessment/Plan: R knee cellulitis S/P R knee arthrodesis via IM nailing  -The patient has no constitutional symptoms.  Normal white count.  The patient's localized symptoms appear to be  improving on ABX.  Currently it is not medically necessary to take the patient to the OR for I&D.  Recommend that once patient is stable for D/C that he follow up with Dr. Ernst Spell at [facility] for continued care  -ABX per [facility] team  -Dry dressing changes prn  -WBAT R LE  -Ortho signing off.  Please call with any questions or concerns.  Mechele Claude PA-C [facility] Office:  [phone]  Agree with note above.  No urgent need for surgery.  Plan as documented above.

## 2017-07-06 NOTE — [place] Notes (Signed)
ANTICOAGULATION CONSULT NOTE  Pharmacy Consult for Warfarin Indication: DVT  Allergies  Allergen Reactions  . Levofloxacin Anaphylaxis    Per Care Everywhere  . Amoxicillin Swelling    Has patient had a PCN reaction causing immediate rash, facial/tongue/throat swelling, SOB or lightheadedness with hypotension: Yes Has patient had a PCN reaction causing severe rash involving mucus membranes or skin necrosis: No Has patient had a PCN reaction that required hospitalization: No Has patient had a PCN reaction occurring within the last 10 years: No' If all of the above answers are "NO", then may proceed with Cephalosporin use.     Patient Measurements: Height: 5' 5.98" (167.6 cm) Weight: 278 lb (126.1 kg) IBW/kg (Calculated) : 63.76  Vital Signs: Temp: 99.4 F (37.4 C) (06/19 0955) Temp Source: Oral (06/19 0955) BP: 121/68 (06/19 0955) Pulse Rate: 83 (06/19 0601)  Labs: Recent Labs    07/05/17 1247 07/06/17 0543  HGB 10.4* 9.5*  HCT 33.6* 31.2*  PLT 318 281  LABPROT 26.5* 28.5*  INR 2.46 2.70  CREATININE 1.12 0.99    Estimated Creatinine Clearance: 105.8 mL/min (by C-G formula based on SCr of 0.99 mg/dL).   Assessment: Pt came on PTA Warfarin for VTE. Date therapy started 06/03/17 and expected duration until 09/03/17.  CBC stable, no bleeding reported.   Patient reported that his warfarin regimen has been changed. He is supposed to receive 10 g (6/18), 15mg  on 6/19 and 6/20, then re-evaluate on Friday per home health recommendations.  INR remains therapeutic this morning at 2.7.   Goal of Therapy:  INR 2-3 Monitor platelets by anticoagulation protocol: Yes   Plan:  Warfarin 15mg  po x 1 tonight as per home health recommendations Daily CBC and INR  Donnamaria Shands D. Nakiea Metzner, PharmD, BCPS Clinical Pharmacist [phone] Please check AMION for all [facility] numbers 07/06/2017 10:32 AM

## 2017-07-06 NOTE — Progress Notes (Addendum)
Family Medicine Teaching Service Daily Progress Note Intern Pager: [phone]  Patient name: Randy Figueroa Medical record number: [id number] Date of birth: [birth year] Age: 57 y.o. Gender: male  Primary Care Provider: System, Pcp Not In Consultants: Orthopedics Code Status: Full  Pt Overview and Major Events to Date:  Randy Figueroa is a 57 y.o. male presenting with right leg cellulitis. PMH is significant for multiple right prosthetic knee infections, recent VTE, anxiety, GERD, and obesity.  Assessment and Plan:  Cellulitis, acute: Right leg swelling and erythema worsening since 6/16 with purulent drainage. Complex history of multiple hardware infections of knee prosthetic with recent removal of prosthetic and placement of rod from hip to ankle in April 2019. No systemic signs of infection, with no fever, normal WBC at 10.3 > 8.3, no lactic acidosis.  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  Will start antibiotics, consult orthopedics, and contact patient's home orthopedist to help guide management. - Cont vancomycin, pharmacy to dose - consult orthopedic surgery; [facility], Dr. Victorino DikeHewitt appreciate recs - [place] [facility] [place]; agreed with our current plan to treat as cellulitis - cont to monitor margins of cellulitis; improved with less area of erythema this am - monitor fever curve - continue home gabapentin 300 mg TID PRN and tylenol 500 mg Q6H PRN for pain  R arm VTE: Patient takes warfarin at home for recent VTE in R arm thought to be caused by pressure of one of his crutches.  INR measured today by home health nurse and was 2.5. - continue warfarin, pharmacy to dose  Tachycardia, chronic: Improved. HR ranged from 83-120 since admission and down trending.  Along with patient's leg erythema and recent travel, he was worked up for DVT with  venous doppler, which was negative.  Tachycardia was found to be chronic during chart review.  Blood clots also less likely given therapeutic level of warfarin. - continue to monitor  Anxiety: Patient takes Buspar 15 mg BID at home. - continue home Buspar  GERD: Patient takes Protonix 40 mg at home - continue home protonix  Chronic low back pain:  Patient on flexeril 5 mg TID PRN, gabapentin 300 mg TID, and oxycodone 5 mg Q4H PRN. - hold oxycodone unless patient requests, can continue other medications  FEN/GI: Reg Diet PPx: Therapeutic Warfain  Disposition: Home  Subjective:  This am patient feels okay but is understandably disappointed that his right IM rod may be infected. He denies any nausea, vomiting, fever, chills, this am. He states he feels like his right leg swelling and redness have improved and his pain is under control.  Objective: Temp:  [98.3 F (36.8 C)-99.3 F (37.4 C)] 98.4 F (36.9 C) (06/19 0601) Pulse Rate:  [83-120] 83 (06/19 0601) Resp:  [14-20] 18 (06/19 0601) BP: (118-167)/(70-90) 133/80 (06/19 0601) SpO2:  [96 %-100 %] 97 % (06/19 0601) Weight:  [276 lb 0.3 oz (125.2 kg)-278 lb (126.1 kg)] 278 lb (126.1 kg) (06/18 2049) Physical Exam: Gen: Alert and Oriented x 3, NAD HEENT: Normocephalic, atraumatic, PERRLA, EOMI CV: RRR, no murmurs, normal S1, S2 split, +2 pulses dorsalis pedis bilaterally Resp: CTAB, no wheezing, rales, or rhonchi, comfortable work of breathing Abd: non-distended, non-tender, soft, +bs in all four quadrants MSK: Moves all four extremities Ext: no clubbing, cyanosis, or trace edema Skin: warm, dry, intact, no rashes; large area  of erythema that has reduced in size and mainly concentrated around his midline knee scar and proximal tibia area. Still warm to touch and remains swollen.   Laboratory: Recent Labs  Lab 07/05/17 1247 07/06/17 0543  WBC 10.3 8.7  HGB 10.4* 9.5*  HCT 33.6* 31.2*  PLT 318 281   Recent Labs  Lab  07/05/17 1247 07/06/17 0543  NA 140 139  K 3.4* 3.6  CL 102 104  CO2 27 28  BUN 15 13  CREATININE 1.12 0.99  CALCIUM 9.2 8.7*  PROT 7.7  --   BILITOT 0.4  --   ALKPHOS 92  --   ALT 14*  --   AST 14*  --   GLUCOSE 110* 114*   PT/INR: 28.5/2.70  Imaging/Diagnostic Tests:  DG Right Tib/Fib IMPRESSION: 1. The distal right lower extremity intramedullary rod and interlocking hardware appears intact without lucency. 2. Chronic appearing bone destruction at the right knee with nonspecific dystrophic calcifications and absent arthrodesis as described on the femur series today.  DG Femur IMPRESSION: 1. Intramedullary rod in place from the right femur intertrochanteric segment distally into the right tibia appears intact without evidence of loosening. 2. No right knee joint arthrodesis suspected. Chronic appearing bone destruction about the knee with nonspecific dystrophic calcifications.  Arlyce Harman, DO 07/06/2017, 7:13 AM PGY-1, [facility] FPTS Intern pager: [phone], text pages welcome

## 2017-07-07 ENCOUNTER — Inpatient Hospital Stay (HOSPITAL_COMMUNITY): Payer: Medicare Other

## 2017-07-07 LAB — PROTIME-INR
INR: 2.35
Prothrombin Time: 25.5 seconds — ABNORMAL HIGH (ref 11.4–15.2)

## 2017-07-07 LAB — CBC
HEMATOCRIT: 35.1 % — AB (ref 39.0–52.0)
HEMOGLOBIN: 10.7 g/dL — AB (ref 13.0–17.0)
MCH: 25.8 pg — ABNORMAL LOW (ref 26.0–34.0)
MCHC: 30.5 g/dL (ref 30.0–36.0)
MCV: 84.8 fL (ref 78.0–100.0)
Platelets: 350 10*3/uL (ref 150–400)
RBC: 4.14 MIL/uL — ABNORMAL LOW (ref 4.22–5.81)
RDW: 15.5 % (ref 11.5–15.5)
WBC: 9.6 10*3/uL (ref 4.0–10.5)

## 2017-07-07 LAB — BASIC METABOLIC PANEL
ANION GAP: 12 (ref 5–15)
BUN: 12 mg/dL (ref 6–20)
CHLORIDE: 102 mmol/L (ref 101–111)
CO2: 26 mmol/L (ref 22–32)
Calcium: 9.1 mg/dL (ref 8.9–10.3)
Creatinine, Ser: 1.02 mg/dL (ref 0.61–1.24)
GFR calc non Af Amer: >60 mL/min (ref >60)
Glucose, Bld: 106 mg/dL — ABNORMAL HIGH (ref 65–99)
Potassium: 3.6 mmol/L (ref 3.5–5.1)
SODIUM: 140 mmol/L (ref 135–145)

## 2017-07-07 LAB — HIV ANTIBODY (ROUTINE TESTING W REFLEX): HIV SCREEN 4TH GENERATION: NONREACTIVE

## 2017-07-07 LAB — VANCOMYCIN, TROUGH: VANCOMYCIN TR: 12 ug/mL — AB (ref 15–20)

## 2017-07-07 MED ORDER — CEPHALEXIN 500 MG PO CAPS: 500.0000 mg | ORAL_CAPSULE | Freq: Two times a day (BID) | ORAL | 0 refills | Status: DC

## 2017-07-07 MED ORDER — CEPHALEXIN 500 MG PO CAPS
500.0000 mg | ORAL_CAPSULE | Freq: Two times a day (BID) | ORAL | Status: DC
  Administered 2017-07-07: 500 mg via ORAL
  Filled 2017-07-07: qty 1

## 2017-07-07 MED ORDER — DOXYCYCLINE HYCLATE 100 MG PO TABS: 100.0000 mg | ORAL_TABLET | Freq: Two times a day (BID) | ORAL | 0 refills | Status: DC

## 2017-07-07 MED ORDER — WARFARIN SODIUM 7.5 MG PO TABS
15.0000 mg | ORAL_TABLET | Freq: Once | ORAL | Status: AC
  Administered 2017-07-07: 15 mg via ORAL
  Filled 2017-07-07: qty 2

## 2017-07-07 MED ORDER — DOXYCYCLINE HYCLATE 100 MG PO TABS
100.0000 mg | ORAL_TABLET | Freq: Two times a day (BID) | ORAL | Status: DC
  Administered 2017-07-07: 100 mg via ORAL
  Filled 2017-07-07: qty 1

## 2017-07-07 MED ORDER — CLINDAMYCIN HCL 300 MG PO CAPS: 300.0000 mg | ORAL_CAPSULE | Freq: Three times a day (TID) | ORAL | Status: DC

## 2017-07-07 MED FILL — DOXYCYCLINE HYCLATE 100 MG: 100 | 12 days supply | Qty: 24 | Fill #0

## 2017-07-07 MED FILL — CEPHALEXIN 500 MG CAPSULE: 500 | 12 days supply | Qty: 24 | Fill #0

## 2017-07-07 NOTE — Care Management Note (Signed)
Case Management Note  Patient Details  Name: Randy InglesDavid A [place] MRN: [id number] Date of Birth: Mar 18, 1960  Subjective/Objective:     57 yr old gentleman admitted with right knee cellulitis,with a PMH significant for multiple R knee infections and recent DVT, that [facility] with concerns over increased redness, swelling and drainage from his right knee.                 Action/Plan: Patient will resume Home Health Services with [facility]. They will be monitoring his INR.    Expected Discharge Date:  07/11/17               Expected Discharge Plan:  Home w Home Health Services  In-House Referral:  NA  Discharge planning Services  CM Consult  Post Acute Care Choice:  Home Health, Resumption of Svcs/PTA Provider Choice offered to:  Patient  DME Arranged:  N/A DME Agency:  NA  HH Arranged:  RN HH Agency[facility]  Status of Service:  Completed, signed off  If discussed at [employer] Meetings, dates discussed:    Additional Comments:  [name], Delmar Dondero Naomi, RN 07/07/2017, 1:06 PM

## 2017-07-07 NOTE — Progress Notes (Signed)
ANTICOAGULATION CONSULT NOTE  Pharmacy Consult for Warfarin Indication: DVT  Allergies  Allergen Reactions  . Levofloxacin Anaphylaxis    Per Care Everywhere  . Amoxicillin Swelling    Has patient had a PCN reaction causing immediate rash, facial/tongue/throat swelling, SOB or lightheadedness with hypotension: Yes Has patient had a PCN reaction causing severe rash involving mucus membranes or skin necrosis: No Has patient had a PCN reaction that required hospitalization: No Has patient had a PCN reaction occurring within the last 10 years: No' If all of the above answers are "NO", then may proceed with Cephalosporin use.     Patient Measurements: Height: 5' 5.98" (167.6 cm) Weight: 278 lb (126.1 kg) IBW/kg (Calculated) : 63.76  Vital Signs: Temp: 98.3 F (36.8 C) (06/20 0900) Temp Source: Oral (06/20 0900) BP: 139/87 (06/20 0900) Pulse Rate: 85 (06/20 0900)  Labs: Recent Labs    07/05/17 1247 07/06/17 0543 07/07/17 0555  HGB 10.4* 9.5* 10.7*  HCT 33.6* 31.2* 35.1*  PLT 318 281 350  LABPROT 26.5* 28.5* 25.5*  INR 2.46 2.70 2.35  CREATININE 1.12 0.99 1.02    Estimated Creatinine Clearance: 102.7 mL/min (by C-G formula based on SCr of 1.02 mg/dL).   Assessment: Pt came on PTA Warfarin for VTE. Date therapy started 06/03/17 and expected duration until 09/03/17.  CBC stable, no bleeding reported.   Patient reported that his warfarin regimen has been changed. He is supposed to receive 10 g (6/18), 15mg  on 6/19 and 6/20, then re-evaluate on Friday per home health recommendations.  INR remains therapeutic this morning at 2.35. Patient is stopping vancomycin (trough was 2512mcg/mL this morning) and starting doxycycline + Keflex today for cellulitis treatment. Renal function stable.  Goal of Therapy:  INR 2-3 Monitor platelets by anticoagulation protocol: Yes   Plan:  Warfarin 15mg  po x 1 tonight Daily CBC and INR Follow for discharge plan/follow up appointment plan.  Will likely still need higher doses of warfarin despite PO antibiotics as he has been difficult to get in range in the past  Randy Figueroa D. Penelope Fittro, PharmD, BCPS Clinical Pharmacist [phone] Please check AMION for all [facility] numbers 07/07/2017 10:38 AM

## 2017-07-07 NOTE — Progress Notes (Signed)
Spoke with Mr. Veleta MinersOllis of [facility] about patient's ultrasound findings.  He spoke with Dr. Victorino DikeHewitt, who agreed with Mr. Veleta MinersOllis that patient's ultrasound findings do not warrant intervention other than his current antibiotic regimen since he has not constitutional symptoms of infection and that he can be discharged as long as cellulitis continues to improve.  Amanda C. Frances FurbishWinfrey, MD PGY-1, [facility] 07/07/2017 4:32 PM

## 2017-07-07 NOTE — Progress Notes (Addendum)
Family Medicine Teaching Service Daily Progress Note Intern Pager: [phone]  Patient name: Randy Figueroa Medical record number: [id number] Date of birth: Jun 25, 1960 Age: 57 y.o. Gender: male  Primary Care Provider: System, Pcp Not In Consultants: Orthopedics Code Status: Full  Pt Overview and Major Events to Date:  Randy Figueroa is a 57 y.o. male presenting with right leg cellulitis. PMH is significant for multiple right prosthetic knee infections, recent VTE, anxiety, GERD, and obesity.  Assessment and Plan:  Cellulitis, acute: Right leg swelling and erythema worsening since 6/16 with purulent drainage. Complex history of multiple hardware infections of knee prosthetic with recent removal of prosthetic and placement of rod from hip to ankle in April 2019. No systemic signs of infection, with no fever, normal WBC at 10.3 > 8.3 > 9.6. 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 Knee had significant drainage overnight. - Discont vancomycin, switch to Doxycyline 100mg  BID and Keflex 500mg  BID per day for 14 days. - consult orthopedic surgery; No need for I&D now follow up with ortho in [place] - contact patient's orthopedist in [facility]; agreed with our current plan to treat as cellulitis - Right knee ultrasound today - monitor fever curve - continue home gabapentin 300 mg TID PRN and tylenol 500 mg Q6H PRN for pain  R arm VTE: Patient takes warfarin at home for recent VTE in R arm thought to be caused by pressure of one of his crutches. INR measured today by home health nurse and was 2.5. - continue warfarin, pharmacy to dose  Tachycardia, chronic: Resolved. HR ranged from 83-97 past 24 hours. Worked up for DVT with venous doppler, which was negative.  Tachycardia was found to be chronic during chart review.  - continue to monitor  Anxiety: Patient takes Buspar 15 mg BID at  home. - continue home Buspar  GERD: Patient takes Protonix 40 mg at home - continue home protonix  Chronic low back pain:  Patient on flexeril 5 mg TID PRN, gabapentin 300 mg TID, and oxycodone 5 mg Q4H PRN. - hold oxycodone unless patient requests, can continue other medications  FEN/GI: Reg Diet PPx: Therapeutic Warfain  Disposition: Home  Subjective:  This am patient feels much better. He was placed on scheduled pain medication and he states that is controlling his pain well. He feels good this morning. Patient is amendable to potential discharge this evening vs early morning tomorrow. He denies any fever, chills, nausea, or vomiting.  Objective: Temp:  [98.4 F (36.9 C)-99.4 F (37.4 C)] 98.4 F (36.9 C) (06/20 0525) Pulse Rate:  [89-97] 89 (06/20 0525) Resp:  [14-20] 14 (06/20 0525) BP: (116-121)/(61-81) 116/81 (06/20 0525) SpO2:  [96 %-99 %] 98 % (06/20 0525) Physical Exam: Gen: Alert and Oriented x 3, NAD HEENT: Normocephalic, atraumatic, PERRLA, EOMI CV: RRR, no murmurs, normal S1, S2 split, +2 pulses dorsalis pedis bilaterally Resp: CTAB, no wheezing, rales, or rhonchi, comfortable work of breathing Abd: non-distended, non-tender, soft, +bs in all four quadrants MSK: Moves all four extremities Ext: no clubbing, cyanosis, or edema Skin: warm, dry, intact, no rashes; large area of erythema that has reduced in size and mainly concentrated around his midline knee scar and proximal tibia area. Still warm to touch and remains swollen.    Laboratory: Recent Labs  Lab 07/05/17 1247 07/06/17 0543 07/07/17 0555  WBC 10.3 8.7 9.6  HGB 10.4*  9.5* 10.7*  HCT 33.6* 31.2* 35.1*  PLT 318 281 350   Recent Labs  Lab 07/05/17 1247 07/06/17 0543 07/07/17 0555  NA 140 139 140  K 3.4* 3.6 3.6  CL 102 104 102  CO2 27 28 26   BUN 15 13 12   CREATININE 1.12 0.99 1.02  CALCIUM 9.2 8.7* 9.1  PROT 7.7  --   --   BILITOT 0.4  --   --   ALKPHOS 92  --   --   ALT 14*  --    --   AST 14*  --   --   GLUCOSE 110* 114* 106*   PT/INR: 28.5/2.70  Imaging/Diagnostic Tests:  DG Right Tib/Fib IMPRESSION: 1. The distal right lower extremity intramedullary rod and interlocking hardware appears intact without lucency. 2. Chronic appearing bone destruction at the right knee with nonspecific dystrophic calcifications and absent arthrodesis as described on the femur series today.  DG Femur IMPRESSION: 1. Intramedullary rod in place from the right femur intertrochanteric segment distally into the right tibia appears intact without evidence of loosening. 2. No right knee joint arthrodesis suspected. Chronic appearing bone destruction about the knee with nonspecific dystrophic calcifications.  Arlyce HarmanLockamy, Christiann Hagerty, DO 07/07/2017, 7:01 AM PGY-1, [facility] FPTS Intern pager: [phone], text pages welcome

## 2017-07-07 NOTE — Discharge Instructions (Signed)
Please take Doxycycline 100 mg twice daily and Keflex 500 mg twice daily, with the last day of antibiotics July 1.  A home health nurse will recheck your INR.   Please come to the Family Medicine Clinic at the time and date listed in this paperwork.

## 2017-07-07 NOTE — Progress Notes (Signed)
Randy Figueroa to be D/C'd Home per MD order.  Discussed prescriptions and follow up appointments with the patient. Prescriptions given to patient's son, medication list explained in detail. Pt verbalized understanding.  Allergies as of 07/07/2017      Reactions   Levofloxacin Anaphylaxis   Per Care Everywhere   Amoxicillin Swelling   Has patient had a PCN reaction causing immediate rash, facial/tongue/throat swelling, SOB or lightheadedness with hypotension: Yes Has patient had a PCN reaction causing severe rash involving mucus membranes or skin necrosis: No Has patient had a PCN reaction that required hospitalization: No Has patient had a PCN reaction occurring within the last 10 years: No' If all of the above answers are "NO", then may proceed with Cephalosporin use.      Medication List    TAKE these medications   acetaminophen 500 MG tablet Commonly known as:  TYLENOL Take 500 mg by mouth every 6 (six) hours as needed.   busPIRone 7.5 MG tablet Commonly known as:  BUSPAR Take 15 mg by mouth 2 (two) times daily.   cephALEXin 500 MG capsule Commonly known as:  KEFLEX Take 1 capsule (500 mg total) by mouth every 12 (twelve) hours for 12 days.   cyclobenzaprine 5 MG tablet Commonly known as:  FLEXERIL Take 5 mg by mouth 3 (three) times daily as needed for muscle spasms.   doxycycline 100 MG tablet Commonly known as:  VIBRA-TABS Take 1 tablet (100 mg total) by mouth every 12 (twelve) hours for 12 days.   enoxaparin 40 MG/0.4ML injection Commonly known as:  LOVENOX Inject 0.4 mLs into the skin See admin instructions. NIGHTLY FOR 10 DAYS STOP TAKING WHEN INR REACHES 2.0   EPIPEN 2-PAK 0.3 mg/0.3 mL Soaj injection Generic drug:  EPINEPHrine Inject 1 pen into the muscle daily as needed for anaphylaxis.   gabapentin 300 MG capsule Commonly known as:  NEURONTIN Take 300 mg by mouth 3 (three) times daily.   oxyCODONE 5 MG immediate release tablet Commonly known as:  Oxy  IR/ROXICODONE Take 5-10 mg by mouth every 4 (four) hours as needed for pain.   pantoprazole 40 MG tablet Commonly known as:  PROTONIX Take 40 mg by mouth daily.   warfarin 5 MG tablet Commonly known as:  COUMADIN Take 10-15 mg by mouth See admin instructions. Daily based on INR reading       Vitals:   07/07/17 0900 07/07/17 1600  BP: 139/87 130/90  Pulse: 85 (!) 101  Resp: 18 19  Temp: 98.3 F (36.8 C) 99.3 F (37.4 C)  SpO2: 100% 96%    IV catheter discontinued intact. Site without signs and symptoms of complications. Dressing and pressure applied. Pt denies pain at this time. No complaints noted.  An After Visit Summary was printed and given to the patient. Patient escorted via WC, and D/C home via private auto.

## 2017-07-11 ENCOUNTER — Ambulatory Visit (INDEPENDENT_AMBULATORY_CARE_PROVIDER_SITE_OTHER): Payer: Medicare Other | Admitting: Internal Medicine

## 2017-07-11 ENCOUNTER — Encounter: Payer: Self-pay | Admitting: Internal Medicine

## 2017-07-11 ENCOUNTER — Other Ambulatory Visit: Payer: Self-pay

## 2017-07-11 VITALS — BP 138/80 | HR 85 | Temp 98.7°F | Ht 65.0 in | Wt 276.0 lb

## 2017-07-11 DIAGNOSIS — L03115 Cellulitis of right lower limb: Secondary | ICD-10-CM | POA: Diagnosis present

## 2017-07-11 DIAGNOSIS — I82621 Acute embolism and thrombosis of deep veins of right upper extremity: Secondary | ICD-10-CM

## 2017-07-11 LAB — POCT INR: INR: 3.9 — AB (ref 2.0–3.0)

## 2017-07-11 MED ORDER — CEPHALEXIN 500 MG PO CAPS: 500.0000 mg | ORAL_CAPSULE | Freq: Two times a day (BID) | ORAL | 0 refills | Status: DC

## 2017-07-11 MED ORDER — DOXYCYCLINE HYCLATE 100 MG PO TABS
100.0000 mg | ORAL_TABLET | Freq: Two times a day (BID) | ORAL | 0 refills | Status: DC
Start: 2017-07-11 — End: 2017-07-20

## 2017-07-11 NOTE — Patient Instructions (Signed)
[name],  Continue antibiotics as long as redness continues, likely until you are seen by Orthopedics.  Please let your nurse from NH know your INR was elevated at 3.9 today. Please call me if you would like additional instruction.  If you have worsening redness, trouble tolerating food, confusion, or fever, please go to the Emergency Department.  Best, Dr. Sampson GoonFitzgerald

## 2017-07-11 NOTE — Progress Notes (Signed)
[facility]  Subjective:  Randy InglesDavid A St Figueroa is a 57 y.o. male with history of multiple right prosthetic knee infections, recent upper extremity VTE in setting of having PICC line, anxiety, GERD, and obesity who presents for hospital follow-up for right leg cellulitis. He was admitted from 6/18-6/20. He was initially started on vancomycin but then was switched to keflex and doxycyline. He had clinical improvement with reduction in area of redness, lack of fever, and decreasing WBC. He has been on aggressive regimens including 6 weeks of vancomycin at home [facility]([place]). Patient has noted continued improvement in leg redness and thinks area is at least 50% better. He continues to have drainage from his knees, which he has been keeping covered with abd pads and gauze. He wonders if he should be on prophylaxis for infection going forward.  He has home health coming for INR checks twice weekly while visiting his daughters in town. Initially had trouble being in therapeutic range. He is not sure why he is on coumadin rather than a NOAC. He took 15 mg two and three nights ago and 10 mg last night. He has a nurse who instructs him what dose [place].   He says he had left a message with his orthopedic surgeon's office asking for appointment once home after 4th of July but has not heard back yet. [facility] has previously been involved in discussing his treatment plan.   ROS: No fevers, reports good appetite  Allergies  Allergen Reactions  . Levofloxacin Anaphylaxis    Per Care Everywhere  . Amoxicillin Swelling    Has patient had a PCN reaction causing immediate rash, facial/tongue/throat swelling, SOB or lightheadedness with hypotension: Yes Has patient had a PCN reaction causing severe rash involving mucus membranes or skin necrosis: No Has patient had a PCN reaction that required hospitalization: No Has patient had a PCN  reaction occurring within the last 10 years: No' If all of the above answers are "NO", then may proceed with Cephalosporin use.     Social History   Tobacco Use  . Smoking status: Never Smoker  . Smokeless tobacco: Never Used  Substance Use Topics  . Alcohol use: Not on file    Objective: Blood pressure 138/80, pulse 85, temperature 98.7 F (37.1 C), temperature source Oral, height 5\' 5"  (1.651 m), weight 276 lb (125.2 kg), SpO2 93 %. Body mass index is 45.93 kg/m. Constitutional: Pleasant, obese male in NAD HENT: MMM Cardiovascular: RRR, S1, S2, no m/r/g.  Pulmonary/Chest: Effort normal and breath sounds normal.  Musculoskeletal: 1+ edema surrounding right knee. Using crutches.  Skin: Receding erythema from marked region above and below right knee with increased warmth over knee. Folded 4x4 of gauze soaked with serosanginous fluid.  Psychiatric: Normal mood and affect.  Vitals reviewed  Assessment/Plan: Cellulitis - Improving but still with significant erythema. Will provide prescription for another 2 weeks of antibiotics (doxycycline and keflex) for patient to continue until he is seen by his regular providers who have been following his previous right knee infections.  - Gave strict return precautions (development of pain, spreading of redness, fever, confusion).   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 Elevated at 3.9. Likely in setting of taking doxycycline. - Patient to discuss with NH nurse today by phone who has been dosing his coumadin since initiation. Told him to anticipate holding tonight's dose.   Follow-up prn if  will be staying in [place] longer than expected.  Randy Gobble, MD [facility], PGY-3

## 2017-07-13 ENCOUNTER — Encounter: Payer: Self-pay | Admitting: Internal Medicine

## 2017-07-13 DIAGNOSIS — I82621 Acute embolism and thrombosis of deep veins of right upper extremity: Secondary | ICD-10-CM | POA: Insufficient documentation

## 2017-07-13 NOTE — Assessment & Plan Note (Signed)
-   INR checked today. Elevated at 3.9. Likely in setting of taking doxycycline. - Patient to discuss with NH nurse today by phone who has been dosing his coumadin since initiation. Told him to anticipate holding tonight's dose.

## 2017-07-13 NOTE — Assessment & Plan Note (Signed)
-   Improving but still with significant erythema. Will provide prescription for another 2 weeks of antibiotics (doxycycline and keflex) for patient to continue until he is seen by his regular providers who have been following his previous right knee infections.  - Gave strict return precautions (development of pain, spreading of redness, fever, confusion).

## 2017-07-17 ENCOUNTER — Other Ambulatory Visit: Payer: Self-pay

## 2017-07-17 ENCOUNTER — Encounter (HOSPITAL_COMMUNITY): Payer: Self-pay | Admitting: Emergency Medicine

## 2017-07-17 ENCOUNTER — Inpatient Hospital Stay (HOSPITAL_COMMUNITY): Payer: Medicare Other

## 2017-07-17 ENCOUNTER — Inpatient Hospital Stay (HOSPITAL_COMMUNITY)
Admission: EM | Admit: 2017-07-17 | Discharge: 2017-07-20 | DRG: 560 | Disposition: A | Discharge status: Home Health | Payer: Medicare Other | Attending: Family Medicine | Admitting: Family Medicine

## 2017-07-17 DIAGNOSIS — T8450XA Infection and inflammatory reaction due to unspecified internal joint prosthesis, initial encounter: Secondary | ICD-10-CM | POA: Diagnosis present

## 2017-07-17 DIAGNOSIS — Z7901 Long term (current) use of anticoagulants: Secondary | ICD-10-CM

## 2017-07-17 DIAGNOSIS — Y831 Surgical operation with implant of artificial internal device as the cause of abnormal reaction of the patient, or of later complication, without mention of misadventure at the time of the procedure: Secondary | ICD-10-CM | POA: Diagnosis present

## 2017-07-17 DIAGNOSIS — Z86718 Personal history of other venous thrombosis and embolism: Secondary | ICD-10-CM

## 2017-07-17 DIAGNOSIS — M869 Osteomyelitis, unspecified: Secondary | ICD-10-CM | POA: Diagnosis present

## 2017-07-17 DIAGNOSIS — R791 Abnormal coagulation profile: Secondary | ICD-10-CM | POA: Diagnosis present

## 2017-07-17 DIAGNOSIS — M545 Low back pain: Secondary | ICD-10-CM | POA: Diagnosis present

## 2017-07-17 DIAGNOSIS — B9689 Other specified bacterial agents as the cause of diseases classified elsewhere: Secondary | ICD-10-CM | POA: Diagnosis present

## 2017-07-17 DIAGNOSIS — M009 Pyogenic arthritis, unspecified: Secondary | ICD-10-CM

## 2017-07-17 DIAGNOSIS — G8929 Other chronic pain: Secondary | ICD-10-CM | POA: Diagnosis present

## 2017-07-17 DIAGNOSIS — Z95828 Presence of other vascular implants and grafts: Secondary | ICD-10-CM | POA: Diagnosis not present

## 2017-07-17 DIAGNOSIS — F419 Anxiety disorder, unspecified: Secondary | ICD-10-CM | POA: Diagnosis present

## 2017-07-17 DIAGNOSIS — B957 Other staphylococcus as the cause of diseases classified elsewhere: Secondary | ICD-10-CM | POA: Diagnosis not present

## 2017-07-17 DIAGNOSIS — K219 Gastro-esophageal reflux disease without esophagitis: Secondary | ICD-10-CM | POA: Diagnosis present

## 2017-07-17 DIAGNOSIS — Z6841 Body Mass Index (BMI) 40.0 and over, adult: Secondary | ICD-10-CM | POA: Diagnosis not present

## 2017-07-17 DIAGNOSIS — Z881 Allergy status to other antibiotic agents status: Secondary | ICD-10-CM

## 2017-07-17 DIAGNOSIS — T8453XA Infection and inflammatory reaction due to internal right knee prosthesis, initial encounter: Principal | ICD-10-CM

## 2017-07-17 DIAGNOSIS — Z981 Arthrodesis status: Secondary | ICD-10-CM

## 2017-07-17 DIAGNOSIS — Z79899 Other long term (current) drug therapy: Secondary | ICD-10-CM | POA: Diagnosis not present

## 2017-07-17 DIAGNOSIS — E669 Obesity, unspecified: Secondary | ICD-10-CM | POA: Diagnosis not present

## 2017-07-17 DIAGNOSIS — Z8619 Personal history of other infectious and parasitic diseases: Secondary | ICD-10-CM | POA: Diagnosis not present

## 2017-07-17 DIAGNOSIS — M8638 Chronic multifocal osteomyelitis, other site: Secondary | ICD-10-CM | POA: Diagnosis not present

## 2017-07-17 DIAGNOSIS — L02415 Cutaneous abscess of right lower limb: Secondary | ICD-10-CM | POA: Diagnosis not present

## 2017-07-17 DIAGNOSIS — T8453XD Infection and inflammatory reaction due to internal right knee prosthesis, subsequent encounter: Secondary | ICD-10-CM | POA: Diagnosis not present

## 2017-07-17 HISTORY — DX: Low back pain, unspecified: M54.50

## 2017-07-17 HISTORY — DX: Unspecified osteoarthritis, unspecified site: M19.90

## 2017-07-17 HISTORY — DX: Low back pain: M54.5

## 2017-07-17 HISTORY — DX: Gastro-esophageal reflux disease without esophagitis: K21.9

## 2017-07-17 HISTORY — DX: Personal history of other medical treatment: Z92.89

## 2017-07-17 HISTORY — DX: Other chronic pain: G89.29

## 2017-07-17 HISTORY — DX: Anxiety disorder, unspecified: F41.9

## 2017-07-17 HISTORY — DX: Cellulitis of right lower limb: L03.115

## 2017-07-17 HISTORY — DX: Family history of other specified conditions: Z84.89

## 2017-07-17 LAB — COMPREHENSIVE METABOLIC PANEL
ALBUMIN: 3.3 g/dL — AB (ref 3.5–5.0)
ALK PHOS: 71 U/L (ref 38–126)
ALT: 14 U/L (ref 0–44)
ANION GAP: 12 (ref 5–15)
AST: 16 U/L (ref 15–41)
BUN: 18 mg/dL (ref 6–20)
CALCIUM: 9 mg/dL (ref 8.9–10.3)
CHLORIDE: 103 mmol/L (ref 98–111)
CO2: 25 mmol/L (ref 22–32)
Creatinine, Ser: 1.01 mg/dL (ref 0.61–1.24)
GFR calc Af Amer: >60 mL/min (ref >60)
GFR calc non Af Amer: >60 mL/min (ref >60)
GLUCOSE: 109 mg/dL — AB (ref 70–99)
Potassium: 3.4 mmol/L — ABNORMAL LOW (ref 3.5–5.1)
SODIUM: 140 mmol/L (ref 135–145)
Total Bilirubin: 0.4 mg/dL (ref 0.3–1.2)
Total Protein: 7.7 g/dL (ref 6.5–8.1)

## 2017-07-17 LAB — PROTIME-INR
INR: 1.84
PROTHROMBIN TIME: 21.1 s — AB (ref 11.4–15.2)

## 2017-07-17 LAB — I-STAT CG4 LACTIC ACID, ED: LACTIC ACID, VENOUS: 1.48 mmol/L (ref 0.5–1.9)

## 2017-07-17 LAB — CBC WITH DIFFERENTIAL/PLATELET
Abs Immature Granulocytes: 0.1 10*3/uL (ref 0.0–0.1)
BASOS ABS: 0.1 10*3/uL (ref 0.0–0.1)
Basophils Relative: 1 %
Eosinophils Absolute: 0.2 10*3/uL (ref 0.0–0.7)
Eosinophils Relative: 2 %
HEMATOCRIT: 34 % — AB (ref 39.0–52.0)
HEMOGLOBIN: 10 g/dL — AB (ref 13.0–17.0)
IMMATURE GRANULOCYTES: 1 %
LYMPHS ABS: 1.7 10*3/uL (ref 0.7–4.0)
LYMPHS PCT: 22 %
MCH: 25.1 pg — ABNORMAL LOW (ref 26.0–34.0)
MCHC: 29.4 g/dL — ABNORMAL LOW (ref 30.0–36.0)
MCV: 85.4 fL (ref 78.0–100.0)
Monocytes Absolute: 0.8 10*3/uL (ref 0.1–1.0)
Monocytes Relative: 10 %
Neutro Abs: 4.7 10*3/uL (ref 1.7–7.7)
Neutrophils Relative %: 64 %
Platelets: 686 10*3/uL — ABNORMAL HIGH (ref 150–400)
RBC: 3.98 MIL/uL — AB (ref 4.22–5.81)
RDW: 15.5 % (ref 11.5–15.5)
WBC: 7.6 10*3/uL (ref 4.0–10.5)

## 2017-07-17 LAB — C-REACTIVE PROTEIN: CRP: 7.1 mg/dL — AB (ref <1.0)

## 2017-07-17 LAB — SEDIMENTATION RATE: Sed Rate: 105 mm/hr — ABNORMAL HIGH (ref 0–16)

## 2017-07-17 MED ORDER — ACETAMINOPHEN 650 MG RE SUPP: 650.0000 mg | Freq: Four times a day (QID) | RECTAL | Status: DC | PRN

## 2017-07-17 MED ORDER — ACETAMINOPHEN 325 MG PO TABS
650.0000 mg | ORAL_TABLET | Freq: Four times a day (QID) | ORAL | Status: DC | PRN
  Administered 2017-07-19 – 2017-07-20 (×2): 650 mg via ORAL
  Filled 2017-07-17 (×2): qty 2

## 2017-07-17 MED ORDER — IOHEXOL 300 MG/ML  SOLN
100.0000 mL | Freq: Once | INTRAMUSCULAR | Status: AC | PRN
  Administered 2017-07-17: 100 mL via INTRAVENOUS

## 2017-07-17 MED ORDER — PANTOPRAZOLE SODIUM 40 MG PO TBEC
40.0000 mg | DELAYED_RELEASE_TABLET | Freq: Every day | ORAL | Status: DC
  Administered 2017-07-18 – 2017-07-20 (×2): 40 mg via ORAL
  Filled 2017-07-17 (×2): qty 1

## 2017-07-17 MED ORDER — CHLORHEXIDINE GLUCONATE 4 % EX LIQD
Freq: Once | CUTANEOUS | Status: AC
  Administered 2017-07-17: 18:00:00 via TOPICAL
  Filled 2017-07-17: qty 15

## 2017-07-17 MED ORDER — SENNA 8.6 MG PO TABS
1.0000 | ORAL_TABLET | Freq: Two times a day (BID) | ORAL | Status: DC
  Administered 2017-07-17 – 2017-07-20 (×5): 8.6 mg via ORAL
  Filled 2017-07-17 (×5): qty 1

## 2017-07-17 MED ORDER — CYCLOBENZAPRINE HCL 10 MG PO TABS
5.0000 mg | ORAL_TABLET | Freq: Three times a day (TID) | ORAL | Status: DC
  Administered 2017-07-17 – 2017-07-20 (×7): 5 mg via ORAL
  Filled 2017-07-17 (×7): qty 1

## 2017-07-17 MED ORDER — OXYCODONE HCL 5 MG PO TABS
5.0000 mg | ORAL_TABLET | Freq: Four times a day (QID) | ORAL | Status: DC | PRN
  Administered 2017-07-18 – 2017-07-19 (×3): 5 mg via ORAL
  Filled 2017-07-17 (×4): qty 1

## 2017-07-17 MED ORDER — ONDANSETRON HCL 4 MG/2ML IJ SOLN: 4.0000 mg | Freq: Four times a day (QID) | INTRAMUSCULAR | Status: DC | PRN

## 2017-07-17 MED ORDER — ONDANSETRON HCL 4 MG PO TABS: 4.0000 mg | ORAL_TABLET | Freq: Four times a day (QID) | ORAL | Status: DC | PRN

## 2017-07-17 MED ORDER — DOCUSATE SODIUM 100 MG PO CAPS
100.0000 mg | ORAL_CAPSULE | Freq: Two times a day (BID) | ORAL | Status: DC
  Administered 2017-07-17 – 2017-07-20 (×5): 100 mg via ORAL
  Filled 2017-07-17 (×5): qty 1

## 2017-07-17 MED ORDER — GABAPENTIN 300 MG PO CAPS
300.0000 mg | ORAL_CAPSULE | Freq: Three times a day (TID) | ORAL | Status: DC
  Administered 2017-07-17 – 2017-07-20 (×7): 300 mg via ORAL
  Filled 2017-07-17 (×7): qty 1

## 2017-07-17 MED ORDER — BUSPIRONE HCL 5 MG PO TABS
15.0000 mg | ORAL_TABLET | Freq: Two times a day (BID) | ORAL | Status: DC
  Administered 2017-07-17 – 2017-07-20 (×5): 15 mg via ORAL
  Filled 2017-07-17 (×5): qty 1

## 2017-07-17 NOTE — ED Triage Notes (Addendum)
Pt had fusion to right knee on March 8th. Pt is from out of town but [facility]ulitis to the right knee, was taking antibiotics. Wound is worse with blistering and drainage serosanguinous drainage. Pt still on doxycyline and keflex. Redness and swelling noted to the knee and thigh. Pt states the swelling has increased.

## 2017-07-17 NOTE — H&P (Addendum)
[facility] Admission History and Physical Service Pager: [phone]  Patient name: Randy Figueroa Medical record number: [id number] Date of birth: 1960-06-22 Age: 57 y.o. Gender: male  Primary Care Provider: System, Pcp Not In Consultants: Orthopedics Code Status: FULL  Chief Complaint: R knee pain  Assessment and Plan: Randy Figueroa is a 57 y.o. male presenting with new erythematous drainage from his chronic right knee cellulitis/joint infection with mucopurulent drainage. 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 Patient returns after recent admission of right knee cellulitis with new mucopurulent drainage, continued erythema, and swelling.  Pt afebrile without white count.  Mildly tachycardic likely 2/2 pain however vitals otherwise within normal range on arrival to ED and patient appears comfortable during exam.  Orthopedics and ID consulted in ED.  - Admit to med-surg, attending Dr. Erin Hearing - ID consulted; Do not start antibiotics due to concern for increasing bacterial resistance - Orthopedics consulted; consulting with patient's home orthopedic surgeon. Possible washout tomorrow.  - Will make NPO at [place] for procedure.  - F/u bcx and wound cx - Tylenol 650 mg Q6 PRN  - zofran prn  - AM labs  - vitals per unit routine   R arm VTE: Patient taking warfarin at home for history of VTE in R arm thought to be caused by pressure of one of his crutches. INR on admission was 1.84 - continue warfarin, pharmacy to dose  Anxiety: Patient takes Buspar 15 mg BID at home. - continue home Buspar  GERD: Patient takes Protonix 40 mg at home - continue home protonix daily  Chronic low back pain:  Patient on flexeril 5 mg TID PRN, and oxycodone 5 mg PRN.  Patient states he has not need pain medication for the past few days. - cont scheduled flexeril 35m TID - cont oxycodone [id number] as needed  FEN/GI: NPO after midnight Prophylaxis: On Warfarin  Disposition: admit to med-surg, attending Dr. ChErin Hearing History of Present Illness:  Randy Figueroa a [age].o. male presenting with right knee pain and new mucopurulent drainage and surrounding erythema. Patient was recently admitted last week and treated for cellulitis of the right knee. He was initially started on IV vancomycin and then transitioned to oral doxycycline and keflex for MRSA and strep beta-hemolytic coverage. He did well and had follow up in our office. He returns for admission due to continued right knee swelling, redness, and new mucopurulent drainage. He denies fever, chills, nausea, vomiting, and has had some generalized fatigue.  Review Of Systems: Per HPI with the following additions:  Review of Systems  Constitutional: Positive for malaise/fatigue. Negative for chills and fever.  HENT: Negative for congestion, sinus pain and sore throat.   Respiratory: Negative for cough, sputum production, shortness of breath and wheezing.   Gastrointestinal: Positive for heartburn. Negative for abdominal pain, constipation, diarrhea, nausea and vomiting.  Genitourinary: Negative for dysuria.  Musculoskeletal: Positive for joint pain.  Neurological: Negative for dizziness and headaches.    Patient Active Problem List   Diagnosis Date Noted  . 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 Cellulitis 07/05/2017   Past Medical History: Past Medical History:  Diagnosis Date  . [facility]   Past Surgical  History: Past Surgical History:  Procedure Laterality Date  . KNEE SURGERY     Social History: Social History   Tobacco Use  . Smoking status: Never Smoker  . Smokeless tobacco: Never Used  Substance Use Topics  . Alcohol  use: Not on file  . Drug use: Not on file   Please also refer to relevant sections of EMR.  Family History: No family history on file.  Non-pertinent  Allergies and Medications: Allergies  Allergen Reactions  . Levofloxacin Anaphylaxis    Per Care Everywhere  . Amoxicillin Swelling    Has patient had a PCN reaction causing immediate rash, facial/tongue/throat swelling, SOB or lightheadedness with hypotension: Yes Has patient had a PCN reaction causing severe rash involving mucus membranes or skin necrosis: No Has patient had a PCN reaction that required hospitalization: No Has patient had a PCN reaction occurring within the last 10 years: No' If all of the above answers are "NO", then may proceed with Cephalosporin use.    No current facility-administered medications on file prior to encounter.    Current Outpatient Medications on File Prior to Encounter  Medication Sig Dispense Refill  . acetaminophen (TYLENOL) 500 MG tablet Take 500 mg by mouth every 6 (six) hours as needed.    . busPIRone (BUSPAR) 7.5 MG tablet Take 15 mg by mouth 2 (two) times daily.  1  . cephALEXin (KEFLEX) 500 MG capsule Take 1 capsule (500 mg total) by mouth every 12 (twelve) hours for 12 days. 24 capsule 0  . cephALEXin (KEFLEX) 500 MG capsule Take 1 capsule (500 mg total) by mouth 2 (two) times daily. 28 capsule 0  . cyclobenzaprine (FLEXERIL) 5 MG tablet Take 5 mg by mouth 3 (three) times daily as needed for muscle spasms.  0  . doxycycline (VIBRA-TABS) 100 MG tablet Take 1 tablet (100 mg total) by mouth every 12 (twelve) hours for 12 days. 24 tablet 0  . doxycycline (VIBRA-TABS) 100 MG tablet Take 1 tablet (100 mg total) by mouth 2 (two) times daily. 28 tablet 0  . enoxaparin (LOVENOX) 40 MG/0.4ML injection Inject 0.4 mLs into the skin See admin instructions. NIGHTLY FOR 10 DAYS STOP TAKING WHEN INR REACHES 2.0  0  . EPINEPHrine (EPIPEN 2-PAK) 0.3 mg/0.3 mL IJ SOAJ injection Inject 1 pen into the muscle  daily as needed for anaphylaxis.    Marland Kitchen gabapentin (NEURONTIN) 300 MG capsule Take 300 mg by mouth 3 (three) times daily.    Marland Kitchen oxyCODONE (OXY IR/ROXICODONE) 5 MG immediate release tablet Take 5-10 mg by mouth every 4 (four) hours as needed for pain.  0  . pantoprazole (PROTONIX) 40 MG tablet Take 40 mg by mouth daily.  3  . warfarin (COUMADIN) 5 MG tablet Take 10-15 mg by mouth See admin instructions. Daily based on INR reading  1    Objective: BP (!) 145/78   Pulse (!) 109   Temp 98.4 F (36.9 C) (Oral)   Resp 16   Ht '5\' 6"'$  (1.676 m)   Wt 276 lb (125.2 kg)   SpO2 94%   BMI 44.55 kg/m    Exam: Gen: pleasant 57 yo male, NAD  HEENT: Normocephalic, atraumatic, PERRLA, EOMI CV: RRR, no murmurs, normal S1, S2, +2 pulses dorsalis pedis bilaterally Resp: CTAB, no wheezing, rales, or rhonchi, comfortable work of breathing Abd: non-distended, non-tender, soft, +bs in all four quadrants  MSK: Moves all four extremities, right knee midline scar with serosanguinous drainage distal to the knee joint. Newly  draining erythematous area proximal to the right knee joint  Ext: no clubbing, cyanosis, or edema Neuro: CN II-XII intact, no focal or gross deficits Skin: warm, dry, intact, no rashes Psych: appropriate behavior, mood, denies any suicidal ideation  Labs and Imaging: CBC BMET  Recent Labs  Lab 07/17/17 1038  WBC 7.6  HGB 10.0*  HCT 34.0*  PLT 686*   Recent Labs  Lab 07/17/17 1038  NA 140  K 3.4*  CL 103  CO2 25  BUN 18  CREATININE 1.01  GLUCOSE 109*  CALCIUM 9.0     BCx: pending Wound Cx: pending CRP: pending ESR: pending Lactic Acid: 1.48 INR: 1.84 PT: 21.1  CT Lower Ext Right w/Contrast IMPRESSION: 1. Complex thick-walled fluid collections anterior to the fused knee joint, involving the subcutaneous tissues, vastus intermedius, rectus femoris, and vastus medialis muscles and probably extending to the cutaneous surface. These collections are in direct  contact with graft material and there is lack of bony bridging at the fusion site. There is also some lucency extending around the intramedullary nail both in the distal femur (causing scalloping of the endostium) and in the proximal tibia. The appearance is concerning for potential infection involving the anterior subcutaneous and muscular tissues with abscesses, and probably tracking in the fusion site and along the adjacent portions of the intramedullary nail.  Nuala Alpha, DO 07/17/2017, 6:27 PM PGY-1, [facility] pager: [phone], text pages welcome  I have seen and evaluated the above patient with Dr. Garlan Fillers and agree with his documentation.  I have included my edits in blue.   Lovenia Kim MD  [place] PGY-2

## 2017-07-17 NOTE — ED Provider Notes (Addendum)
[facility] EMERGENCY DEPARTMENT Provider Note   CSN: [id number] Arrival date & time: 07/17/17  1016     History   Chief Complaint No chief complaint on file.   HPI Randy Figueroa is a 57 y.o. male.  HPI  Patient is a 25-year male with a history of DVT of the right upper extremity, provoked by PICC line, presenting for drainage of wound site of the right knee.  Patient is from out of town in [place], and had an operative course in 2019 consisting of an antibiotic spacer placed on March 8 at [facility] and then a fusion of the right knee in April 2019.  Subsequently, patient developed clinical cellulitis of the right lower extremity in June 2018 for which she was admitted to the hospital, placed on vancomycin, and then completed outpatient course of antibiotics consisting of doxycycline and Keflex.  Patient reports that the erythema has significantly reduced since the oral antibiotics, but he continues to have some isolated erythema over anterior knee as well as some swelling and serosanguineous to purulent drainage from the knee.  Patient is on warfarin for his history of upper extremity DVT, continues to be therapeutic with most readings.  Patient does have home health nursing coming to help him with dressing changes of the right knee.  Patient denies any fevers, chills, calf pain or tenderness, increasing erythema, or pain of the right knee.  Past Medical History:  Diagnosis Date  . DVT (deep venous thrombosis) [facility])     Patient Active Problem List   Diagnosis Date Noted  . 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 Cellulitis 07/05/2017    Past Surgical History:  Procedure Laterality Date  . KNEE SURGERY          Home Medications    Prior to Admission medications   Medication Sig Start Date End Date Taking? Authorizing Provider  acetaminophen (TYLENOL) 500 MG tablet Take 500 mg by mouth every 6 (six) hours  as needed.    [provider]  busPIRone (BUSPAR) 7.5 MG tablet Take 15 mg by mouth 2 (two) times daily. 06/20/17   [provider]  cephALEXin (KEFLEX) 500 MG capsule Take 1 capsule (500 mg total) by mouth every 12 (twelve) hours for 12 days. 07/07/17 07/19/17  Bufford Lope, DO  cephALEXin (KEFLEX) 500 MG capsule Take 1 capsule (500 mg total) by mouth 2 (two) times daily. 07/11/17   Rogue Bussing, MD  cyclobenzaprine (FLEXERIL) 5 MG tablet Take 5 mg by mouth 3 (three) times daily as needed for muscle spasms. 06/29/17   [provider]  doxycycline (VIBRA-TABS) 100 MG tablet Take 1 tablet (100 mg total) by mouth every 12 (twelve) hours for 12 days. 07/07/17 07/19/17  Bufford Lope, DO  doxycycline (VIBRA-TABS) 100 MG tablet Take 1 tablet (100 mg total) by mouth 2 (two) times daily. 07/11/17   Rogue Bussing, MD  enoxaparin (LOVENOX) 40 MG/0.4ML injection Inject 0.4 mLs into the skin See admin instructions. NIGHTLY FOR 10 DAYS STOP TAKING WHEN INR REACHES 2.0 06/20/17   [provider]  EPINEPHrine (EPIPEN 2-PAK) 0.3 mg/0.3 mL IJ SOAJ injection Inject 1 pen into the muscle daily as needed for anaphylaxis.    [provider]  gabapentin (NEURONTIN) 300 MG capsule Take 300 mg by mouth 3 (three) times daily.    [provider]  oxyCODONE (OXY IR/ROXICODONE) 5 MG immediate release tablet Take 5-10 mg  by mouth every 4 (four) hours as needed for pain. 06/06/17   [provider]  pantoprazole (PROTONIX) 40 MG tablet Take 40 mg by mouth daily. 06/21/17   [provider]  warfarin (COUMADIN) 5 MG tablet Take 10-15 mg by mouth See admin instructions. Daily based on INR reading 06/06/17   [provider]    Family History No family history on file.  Social History Social History   Tobacco Use  . Smoking status: Never Smoker  . Smokeless tobacco: Never Used  Substance Use Topics  . Alcohol use: Not on file  . Drug use:  Not on file     Allergies   Levofloxacin and Amoxicillin   Review of Systems Review of Systems  Constitutional: Negative for chills and fever.  Respiratory: Negative for chest tightness and shortness of breath.   Cardiovascular: Negative for chest pain and leg swelling.  Musculoskeletal: Positive for joint swelling. Negative for arthralgias, gait problem and myalgias.  Skin: Positive for color change.       +Wound drainage  Allergic/Immunologic: Negative for immunocompromised state.  All other systems reviewed and are negative.    Physical Exam Updated Vital Signs BP 129/86   Pulse 94   Temp 98.4 F (36.9 C) (Oral)   Resp 16   Ht 5' 6"  (1.676 m)   Wt 125.2 kg (276 lb)   SpO2 100%   BMI 44.55 kg/m   Physical Exam  Constitutional: He appears well-developed and well-nourished. No distress.  HENT:  Head: Normocephalic and atraumatic.  Mouth/Throat: Oropharynx is clear and moist.  Eyes: Pupils are equal, round, and reactive to light. Conjunctivae and EOM are normal.  Neck: Normal range of motion. Neck supple.  Cardiovascular: Normal rate, regular rhythm, S1 normal and S2 normal.  No murmur heard. Pulmonary/Chest: Effort normal and breath sounds normal. He has no wheezes. He has no rales.  Abdominal: Soft. He exhibits no distension. There is no tenderness. There is no guarding.  Musculoskeletal:  See clinical photo for details.  There is erythema surrounding well-healed surgical incision of anterior knee.  There are 2 sites of drainage, with active serosanguineous drainage and underlying induration.  Compartments of the right lower extremity are soft.  No calf tenderness.  Patient has 2+ DP pulse and popliteal pulse of the right lower extremity.  Lymphadenopathy:    He has no cervical adenopathy.  Neurological: He is alert.  Cranial nerves grossly intact. Patient moves extremities symmetrically and with good coordination.  Skin: Skin is warm and dry. No rash noted. No  erythema.  Psychiatric: He has a normal mood and affect. His behavior is normal. Judgment and thought content normal.  Nursing note and vitals reviewed.      ED Treatments / Results  Labs (all labs ordered are listed, but only abnormal results are displayed) Labs Reviewed  COMPREHENSIVE METABOLIC PANEL - Abnormal; Notable for the following components:      Result Value   Potassium 3.4 (*)    Glucose, Bld 109 (*)    Albumin 3.3 (*)    All other components within normal limits  CBC WITH DIFFERENTIAL/PLATELET - Abnormal; Notable for the following components:   RBC 3.98 (*)    Hemoglobin 10.0 (*)    HCT 34.0 (*)    MCH 25.1 (*)    MCHC 29.4 (*)    Platelets 686 (*)    All other components within normal limits  PROTIME-INR - Abnormal; Notable for the following components:   Prothrombin  Time 21.1 (*)    All other components within normal limits  SEDIMENTATION RATE - Abnormal; Notable for the following components:   Sed Rate 105 (*)    All other components within normal limits  C-REACTIVE PROTEIN - Abnormal; Notable for the following components:   CRP 7.1 (*)    All other components within normal limits  AEROBIC CULTURE (SUPERFICIAL SPECIMEN)  CULTURE, BLOOD (ROUTINE X 2)  CULTURE, BLOOD (ROUTINE X 2)  I-STAT CG4 LACTIC ACID, ED    EKG None  Radiology Ct Extremity Lower Right W Contrast  Result Date: 07/17/2017 CLINICAL DATA:  Cellulitis in the vicinity of the right knee, serosanguineous drainage and blistering, fusion of the right knee on March 8th 2019 EXAM: CT OF THE LOWER RIGHT EXTREMITY WITH CONTRAST TECHNIQUE: Multidetector CT imaging of the lower right extremity was performed from the mid thigh through the mid tibia according to the standard protocol following intravenous contrast administration. COMPARISON:  Radiographs dated 07/05/2017 CONTRAST:  140m OMNIPAQUE IOHEXOL 300 MG/ML  SOLN FINDINGS: Bones/Joint/Cartilage The patient has an intramedullary nail extending  from the proximal femur to the distal tibia, traversing the fused knee joint, and a significant portion of this nail is included on today's CT scan. There is some periosteal reaction in the distal femur along with some lucency scalloping the posteromedial and ostium of the distal femur shown for example on image 39/4. Multiple bone graft fragments are present in the vicinity of the resected knee joint, without bony bridging at this time. Lucency surrounds much of the tibial component of the intramedullary nail proximally but not distally. Ligaments Suboptimally assessed by CT. Muscles and Tendons Multiple complex fluid collections are present anterior to the fused knee joint, involving both the subcutaneous tissues and also the vastus intermedius, rectus femoris,, and vastus medialis muscles. There is some atrophy of the distal vastus lateralis. A more superficial collection with thick margins measures about 10.9 by 7.2 by 3.3 cm (volume = 140 cm^3) and appears to communicate with the anterior cutaneous margin, likely contributing to the weeping wound. A more cephalad and medial component is somewhat multilocular and measures 4.9 by 3.9 by 6.4 cm (volume = 64 cm^3), and involves the vastus musculature and also may be contributing to the cutaneous drainage. Notable effacement of surrounding muscular tissues and soft tissue irregularity in this vicinity. I do not see gas within these collections. The collections do extend back to the bone graft margins, for example on image 74/5, and involvement of the fusion site is not excluded. Soft tissues The soft tissues are discussed in conjunction with the muscle/tendon section above. IMPRESSION: 1. Complex thick-walled fluid collections anterior to the fused knee joint, involving the subcutaneous tissues, vastus intermedius, rectus femoris, and vastus medialis muscles and probably extending to the cutaneous surface. These collections are in direct contact with graft material  and there is lack of bony bridging at the fusion site. There is also some lucency extending around the intramedullary nail both in the distal femur (causing scalloping of the endostium) and in the proximal tibia. The appearance is concerning for potential infection involving the anterior subcutaneous and muscular tissues with abscesses, and probably tracking in the fusion site and along the adjacent portions of the intramedullary nail. Electronically Signed   By: WVan ClinesM.D.   On: 07/17/2017 14:12    Procedures Procedures (including critical care time)  Medications Ordered in ED Medications - No data to display   Initial Impression / Assessment and Plan /  ED Course  I have reviewed the triage vital signs and the nursing notes.  Pertinent labs & imaging results that were available during my care of the patient were reviewed by me and considered in my medical decision making (see chart for details).  Clinical Course as of Jul 18 2306  Sun Jul 17, 2017  1240 Seen and examined.  Feel that CT scan of the right lower extremity to look for loculations in abscesses present.  Wound cultures collected, and obtaining proper swab for a anaerobic culture.   [AM]  6269 Reassessed.  Patient given results of CT scan.  Consult placed to orthopedic surgery, [facility] given that patient has a history of consultation by them last week.   [AM]  1644 Spoke with Dr. Veverly Fells of orthopedic surgery who is attempting to reach patient's orthopedic surgeon at [facility].  We will proceed with admission for patient, as well as ID consultation given the multiple failure of antibiotics.  Appreciate orthopedic surgery involvement in the care of this patient.   [AM]  1658 Spoke with Dr. Tommy Medal of [facility] who stated that empriric antibiotics should not be started on this patient, and that oral abx should be stopped. Stated that pt should only have antibiotics after surgical intervention or if  clinically bacteremic. Appreciate Dr. Lucianne Lei Dam's involvement in the care of this patient.    [AM]  [facility] residency service to take over care of patient. Appreciate their involvement in the care of this patient.    [AM]  2017 Spoke with the residency team from family medicine residency service regarding suppressive antibiotics orally.    [AM]    Clinical Course User Index [AM] Albesa Seen, PA-C    Patient is nontoxic-appearing, afebrile, hemodynamically stable, and not showing signs of systemic illness or SIRS at this time.  Patient with complex appearing wound and infection to prosthetic joint with intramedullary rod placed in the right knee.  Work-up in the emergency department reveals no leukocytosis.  Patient has stable anemia at 10.0 today.  Patient does have elevated ESR and CRP.  INR slightly subtherapeutic at 1.4.  CT scan of the right lower extremity demonstrates multiple complex loculations of fluid.  Additionally, there are lucencies around the intramedullary rod concerning for extension into the graft and rod insertion itself.  Records from [facility] reviewed, and it appears that as of May 2019, patient had no growth from joint culture or bone culture of the right knee.  No blood cultures or joint fluid cultures from admission last week to guide antibiotic therapy at this time.    Per discussions above, antibiotics held until surgical plan can be reached. Appreciate these consultant's involvement in care.  This is a shared visit with Dr. Milton Ferguson. Patient was independently evaluated by this attending physician. Attending physician consulted in evaluation and management.  Final Clinical Impressions(s) / ED Diagnoses   Final diagnoses:  Infection of prosthetic joint, initial encounter [facility])    ED Discharge Orders    None         Tamala Julian 07/17/17 2309    Milton Ferguson, MD 07/21/17 1009

## 2017-07-17 NOTE — Consult Note (Signed)
Reason for Consult: Right knee infection s/p fusion Referring Physician: Hollice Gong, MD  Randy Figueroa is an 57 y.o. male.  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  All of patient's care has been at [facility] in [facility]. He has been treated here in [place] for presumptive cellulitis recently with Keflex and Clinda. Noted increased drainage over the last few days and represents with concerns over worsening infection.     Past Medical History:  Diagnosis Date  . DVT (deep venous thrombosis) ([place])     Past Surgical History:  Procedure Laterality Date  . KNEE SURGERY      No family history on file.  Social History:  reports that he has never smoked. He has never used smokeless tobacco. His alcohol and drug histories are not on file.  Allergies:  Allergies  Allergen Reactions  . Levofloxacin Anaphylaxis    Per Care Everywhere  . Amoxicillin Swelling    Has patient had a PCN reaction causing immediate rash, facial/tongue/throat swelling, SOB or lightheadedness with hypotension: Yes Has patient had a PCN reaction causing severe rash involving mucus membranes or skin necrosis: No Has patient had a PCN reaction that required hospitalization: No Has patient had a PCN reaction occurring within the last 10 years: No' If all of the above answers are "NO", then may proceed with Cephalosporin use.     Medications: I have reviewed the patient's current medications.  Results for orders placed or performed during the hospital encounter of 07/17/17 (from the past 48 hour(s))  Comprehensive metabolic panel     Status: Abnormal   Collection Time: 07/17/17 10:38 AM  Result Value Ref Range   Sodium 140 135 - 145 mmol/L   Potassium 3.4 (L) 3.5 - 5.1 mmol/L   Chloride 103 98 - 111 mmol/L    Comment: Please note change in reference range.   CO2 25 22 - 32 mmol/L   Glucose, Bld 109 (H) 70 - 99 mg/dL     Comment: Please note change in reference range.   BUN 18 6 - 20 mg/dL    Comment: Please note change in reference range.   Creatinine, Ser 1.01 0.61 - 1.24 mg/dL   Calcium 9.0 8.9 - 10.3 mg/dL   Total Protein 7.7 6.5 - 8.1 g/dL   Albumin 3.3 (L) 3.5 - 5.0 g/dL   AST 16 15 - 41 U/L   ALT 14 0 - 44 U/L    Comment: Please note change in reference range.   Alkaline Phosphatase 71 38 - 126 U/L   Total Bilirubin 0.4 0.3 - 1.2 mg/dL   GFR calc non Af Amer >60 >60 mL/min   GFR calc Af Amer >60 >60 mL/min    Comment: (NOTE) The eGFR has been calculated using the CKD EPI equation. This calculation has not been validated in all clinical situations. eGFR's persistently <60 mL/min signify possible Chronic Kidney Disease.    Anion gap 12 5 - 15    Comment: Performed at [facility] [address], [place], [place] [address]  CBC with Differential     Status: Abnormal   Collection Time: 07/17/17 10:38 AM  Result Value Ref Range   WBC 7.6 4.0 - 10.5 K/uL   RBC 3.98 (L) 4.22 - 5.81 MIL/uL   Hemoglobin 10.0 (L) 13.0 - 17.0 g/dL   HCT 34.0 (L) 39.0 - 52.0 %  MCV 85.4 78.0 - 100.0 fL   MCH 25.1 (L) 26.0 - 34.0 pg   MCHC 29.4 (L) 30.0 - 36.0 g/dL   RDW 15.5 11.5 - 15.5 %   Platelets 686 (H) 150 - 400 K/uL   Neutrophils Relative % 64 %   Neutro Abs 4.7 1.7 - 7.7 K/uL   Lymphocytes Relative 22 %   Lymphs Abs 1.7 0.7 - 4.0 K/uL   Monocytes Relative 10 %   Monocytes Absolute 0.8 0.1 - 1.0 K/uL   Eosinophils Relative 2 %   Eosinophils Absolute 0.2 0.0 - 0.7 K/uL   Basophils Relative 1 %   Basophils Absolute 0.1 0.0 - 0.1 K/uL   Immature Granulocytes 1 %   Abs Immature Granulocytes 0.1 0.0 - 0.1 K/uL    Comment: Performed at [facility] [address], [place], [place] [address]  Protime-INR     Status: Abnormal   Collection Time: 07/17/17 10:38 AM  Result Value Ref Range   Prothrombin Time 21.1 (H) 11.4 - 15.2 seconds   INR 1.84     Comment: Performed at [facility] [address] [place], [place] [address]  I-Stat CG4 Lactic Acid, ED     Status: None   Collection Time: 07/17/17 10:52 AM  Result Value Ref Range   Lactic Acid, Venous 1.48 0.5 - 1.9 mmol/L  Aerobic Culture (superficial specimen)     Status: None (Preliminary result)   Collection Time: 07/17/17  1:09 PM  Result Value Ref Range   Specimen Description KNEE    Special Requests NONE    Gram Stain      NO WBC SEEN NO ORGANISMS SEEN Performed at [facility], [place] [address] [place], [place] [address]    Culture PENDING    Report Status PENDING   Sedimentation rate     Status: Abnormal   Collection Time: 07/17/17  3:11 PM  Result Value Ref Range   Sed Rate 105 (H) 0 - 16 mm/hr    Comment: Performed at [facility] [address] [place], [place] [address]  C-reactive protein     Status: Abnormal   Collection Time: 07/17/17  3:11 PM  Result Value Ref Range   CRP 7.1 (H) <1.0 mg/dL    Comment: Performed at [facility], [place] [address], [place],  [address]    Ct Extremity Lower Right W Contrast  Result Date: 07/17/2017 CLINICAL DATA:  Cellulitis in the vicinity of the right knee, serosanguineous drainage and blistering, fusion of the right knee on March 8th 2019 EXAM: CT OF THE LOWER RIGHT EXTREMITY WITH CONTRAST TECHNIQUE: Multidetector CT imaging of the lower right extremity was performed from the mid thigh through the mid tibia according to the standard protocol following intravenous contrast administration. COMPARISON:  Radiographs dated 07/05/2017 CONTRAST:  136m OMNIPAQUE IOHEXOL 300 MG/ML  SOLN FINDINGS: Bones/Joint/Cartilage The patient has an intramedullary nail extending from the proximal femur to the distal tibia, traversing the fused knee joint, and a significant portion of this nail is included on today's CT scan. There is some periosteal reaction in the distal femur along with some lucency scalloping the posteromedial and ostium of the distal femur  shown for example on image 39/4. Multiple bone graft fragments are present in the vicinity of the resected knee joint, without bony bridging at this time. Lucency surrounds much of the tibial component of the intramedullary nail proximally but not distally. Ligaments Suboptimally assessed  by CT. Muscles and Tendons Multiple complex fluid collections are present anterior to the fused knee joint, involving both the subcutaneous tissues and also the vastus intermedius, rectus femoris,, and vastus medialis muscles. There is some atrophy of the distal vastus lateralis. A more superficial collection with thick margins measures about 10.9 by 7.2 by 3.3 cm (volume = 140 cm^3) and appears to communicate with the anterior cutaneous margin, likely contributing to the weeping wound. A more cephalad and medial component is somewhat multilocular and measures 4.9 by 3.9 by 6.4 cm (volume = 64 cm^3), and involves the vastus musculature and also may be contributing to the cutaneous drainage. Notable effacement of surrounding muscular tissues and soft tissue irregularity in this vicinity. I do not see gas within these collections. The collections do extend back to the bone graft margins, for example on image 74/5, and involvement of the fusion site is not excluded. Soft tissues The soft tissues are discussed in conjunction with the muscle/tendon section above. IMPRESSION: 1. Complex thick-walled fluid collections anterior to the fused knee joint, involving the subcutaneous tissues, vastus intermedius, rectus femoris, and vastus medialis muscles and probably extending to the cutaneous surface. These collections are in direct contact with graft material and there is lack of bony bridging at the fusion site. There is also some lucency extending around the intramedullary nail both in the distal femur (causing scalloping of the endostium) and in the proximal tibia. The appearance is concerning for potential infection involving the  anterior subcutaneous and muscular tissues with abscesses, and probably tracking in the fusion site and along the adjacent portions of the intramedullary nail. Electronically Signed   By: Van Clines M.D.   On: 07/17/2017 14:12    ROS Blood pressure 133/80, pulse 94, temperature 98.4 F (36.9 C), temperature source Oral, resp. rate 16, height 5' 6"  (1.676 m), weight 125.2 kg (276 lb), SpO2 98 %. Physical Exam Healthy appearing male in NAD, bilateral UEs with normal AROM,  Right LE with healed anterior midline incision with two areas of chronic appearing drainage. Generalized erythema and swelling.  No calf swelling and normal ankle ROM. 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  I have placed a call at the patient's request to his surgeon in [place], Dr Ernst Spell.  Awaiting a call back from the physician regarding their recommendation.  I have also spoken with Dr Hector Shade in our practice regarding his thoughts and recommendation moving forward. Recommend internal medicine admission with ID consult and will hopefully have a definitive plan for treatment tomorrow.  Patient contemplating transport back to [place].  Randy Figueroa,STEVEN R 07/17/2017, 5:08 PM

## 2017-07-18 ENCOUNTER — Encounter (HOSPITAL_COMMUNITY): Payer: Self-pay | Admitting: *Deleted

## 2017-07-18 DIAGNOSIS — Z881 Allergy status to other antibiotic agents status: Secondary | ICD-10-CM

## 2017-07-18 DIAGNOSIS — M009 Pyogenic arthritis, unspecified: Secondary | ICD-10-CM

## 2017-07-18 DIAGNOSIS — Z86718 Personal history of other venous thrombosis and embolism: Secondary | ICD-10-CM

## 2017-07-18 DIAGNOSIS — T8453XA Infection and inflammatory reaction due to internal right knee prosthesis, initial encounter: Principal | ICD-10-CM

## 2017-07-18 DIAGNOSIS — E669 Obesity, unspecified: Secondary | ICD-10-CM

## 2017-07-18 DIAGNOSIS — T8453XD Infection and inflammatory reaction due to internal right knee prosthesis, subsequent encounter: Secondary | ICD-10-CM

## 2017-07-18 DIAGNOSIS — Z8619 Personal history of other infectious and parasitic diseases: Secondary | ICD-10-CM

## 2017-07-18 LAB — COMPREHENSIVE METABOLIC PANEL
ALBUMIN: 2.9 g/dL — AB (ref 3.5–5.0)
ALT: 13 U/L (ref 0–44)
AST: 15 U/L (ref 15–41)
Alkaline Phosphatase: 63 U/L (ref 38–126)
Anion gap: 7 (ref 5–15)
BUN: 14 mg/dL (ref 6–20)
CHLORIDE: 103 mmol/L (ref 98–111)
CO2: 28 mmol/L (ref 22–32)
CREATININE: 0.99 mg/dL (ref 0.61–1.24)
Calcium: 8.7 mg/dL — ABNORMAL LOW (ref 8.9–10.3)
GFR calc Af Amer: >60 mL/min (ref >60)
GFR calc non Af Amer: >60 mL/min (ref >60)
Glucose, Bld: 113 mg/dL — ABNORMAL HIGH (ref 70–99)
Potassium: 3.9 mmol/L (ref 3.5–5.1)
SODIUM: 138 mmol/L (ref 135–145)
Total Bilirubin: 0.5 mg/dL (ref 0.3–1.2)
Total Protein: 6.6 g/dL (ref 6.5–8.1)

## 2017-07-18 LAB — SURGICAL PCR SCREEN
MRSA, PCR: NEGATIVE
Staphylococcus aureus: NEGATIVE

## 2017-07-18 NOTE — Progress Notes (Signed)
Family Medicine Teaching Service Daily Progress Note Intern Pager: [phone]  Patient name: Randy Figueroa Medical record number: [id number] Date of birth: Jun 02, 1960 Age: 57 y.o. Gender: male  Primary Care Provider: System, Pcp Not In Consultants: Orthopedics, Infections Disease Code Status: Full  Pt Overview and Major Events to Date:  6/18-20 - pt admitted for cellulitis of R knee 6/30 - pt readmitted for mucopurulent drainage from R knee  Assessment and Plan: 57 y/o man with multiple R knee surgeries presents with two sinus drainage sights on his right knee.  In the process of finding an appropriate orthopedic surgeon in [place].  Right knee pain w/ two dinus tracts w/ mucopurulent drainage H/O multiple R knee surgeries, now with drainage, warm and red around R knee, afebrile Pt has been seen by ortho and, due to complex orthopedic history, ortho is attempting to organize a physician handoff to an orthopedist in [place].   -No abx started per ID recs, may interfere w/ culture and aid abx resistance in a possibly seeded joint. 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  Pt was accompanied by wife and children in the room.  Pt denied fever/chills overnight.   Objective: Temp:  [98.2 F (36.8 C)-99.3 F (37.4 C)] 99.3 F (37.4 C) (07/01 1422) Pulse Rate:  [83-109] 100 (07/01 1422) Resp:  [16-18] 16 (07/01 1422) BP: (120-153)/(74-92) 132/82 (07/01 1422) SpO2:  [94 %-100 %] 98 % (07/01 1422) Physical Exam: General: Pt sitting in bed  comfortably, talkative and easily understood, AAOx3 Cardiovascular: RRR, no M/R/G, strong peripheral pulses Respiratory: normal respiratory effort, no crackles/wheezing Abdomen: soft, nontender Extremities: Right knee appears swollen and erythematous, two draining sinuses (one superior to patella, one inferior), bandages had been changed earlier that morning and had 10-20cc of mucopurulent fluid.  R knee was tender to palpation and indurated.  R knee significantly swollen compared to unaffected L knee.  Laboratory: Recent Labs  Lab 07/17/17 1038  WBC 7.6  HGB 10.0*  HCT 34.0*  PLT 686*   Recent Labs  Lab 07/17/17 1038 07/18/17 0404  NA 140 138  K 3.4* 3.9  CL 103 103  CO2 25 28  BUN 18 14  CREATININE 1.01 0.99  CALCIUM 9.0 8.7*  PROT 7.7 6.6  BILITOT 0.4 0.5  ALKPHOS 71 63  ALT 14 13  AST 16 15  GLUCOSE 109* 113*    Imaging/Diagnostic Tests: CT knee 6/30: IMPRESSION: 1. Complex thick-walled fluid collections anterior to the fused knee joint, involving the subcutaneous tissues, vastus intermedius, rectus femoris, and vastus medialis muscles and probably extending to the cutaneous surface. These collections are in direct contact with graft material and there is lack of bony bridging at the fusion site. There is also some lucency extending around the intramedullary nail both in the distal femur (causing scalloping of the endostium) and in the proximal tibia. The appearance is concerning for potential infection involving the anterior subcutaneous and muscular tissues with abscesses, and probably tracking in the fusion site and along  the adjacent portions of the intramedullary nail.  Mirian MoFrank, Kerina Simoneau, MD 07/18/2017, 3:48 PM PGY-1, [facility] FPTS Intern pager: [phone], text pages welcome

## 2017-07-18 NOTE — Progress Notes (Signed)
Orthopedics Progress Note  Subjective: No change in knee complaints. Has been elevating leg  Objective:   Lab Results  Component Value Date   WBC 7.6 07/17/2017   HGB 10.0 (L) 07/17/2017   HCT 34.0 (L) 07/17/2017   MCV 85.4 07/17/2017   PLT 686 (H) 07/17/2017       Component Value Date/Time   NA 138 07/18/2017 0404   K 3.9 07/18/2017 0404   CL 103 07/18/2017 0404   CO2 28 07/18/2017 0404   GLUCOSE 113 (H) 07/18/2017 0404   BUN 14 07/18/2017 0404   CREATININE 0.99 07/18/2017 0404   CALCIUM 8.7 (L) 07/18/2017 0404   GFRNONAA >60 07/18/2017 0404   GFRAA >60 07/18/2017 0404    Lab Results  Component Value Date   INR 1.84 07/17/2017   INR 3.9 (A) 07/11/2017   INR 2.35 07/07/2017    Assessment/Plan: 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 ID recommending aspiration of the knee for fluid to send for culture while awaiting definitive care plan. Specialist at [facility] out of town for one week. [facility]. Continue supportive care and daily dry dressing changes  Randy BallsSteven R. Ranell PatrickNorris, MD 07/18/2017 3:13 PM

## 2017-07-18 NOTE — Progress Notes (Signed)
Orthopedics Progress Note  Subjective: Patient reports no increase in pain.  Objective:  Vitals:   07/17/17 2226 07/18/17 0352  BP: 134/79 (!) 153/86  Pulse: 96 83  Resp: 16 18  Temp: 98.4 F (36.9 C) 98.2 F (36.8 C)  SpO2: 97% 99%    General: Awake and alert  Musculoskeletal: right leg remains red and swollen with two draining sinuses. No pain with ankle pumps Neurovascularly intact  Lab Results  Component Value Date   WBC 7.6 07/17/2017   HGB 10.0 (L) 07/17/2017   HCT 34.0 (L) 07/17/2017   MCV 85.4 07/17/2017   PLT 686 (H) 07/17/2017       Component Value Date/Time   NA 138 07/18/2017 0404   K 3.9 07/18/2017 0404   CL 103 07/18/2017 0404   CO2 28 07/18/2017 0404   GLUCOSE 113 (H) 07/18/2017 0404   BUN 14 07/18/2017 0404   CREATININE 0.99 07/18/2017 0404   CALCIUM 8.7 (L) 07/18/2017 0404   GFRNONAA >60 07/18/2017 0404   GFRAA >60 07/18/2017 0404    Lab Results  Component Value Date   INR 1.84 07/17/2017   INR 3.9 (A) 07/11/2017   INR 2.35 07/07/2017    Assessment/Plan: 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 Will contact Dr Marlene BastMason in [place] today.  They will also contact their surgeon in [place] to determine the plan if they head back home Ok to have patient eat   Almedia BallsSteven R. Ranell PatrickNorris, MD 07/18/2017 7:23 AM

## 2017-07-18 NOTE — Progress Notes (Signed)
ANTICOAGULATION CONSULT NOTE - Initial Consult  Pharmacy Consult for Coumadin Indication: h/o VTE  Allergies  Allergen Reactions  . Levofloxacin Anaphylaxis    Per Care Everywhere  . Amoxicillin Swelling    Has patient had a PCN reaction causing immediate rash, facial/tongue/throat swelling, SOB or lightheadedness with hypotension: Yes Has patient had a PCN reaction causing severe rash involving mucus membranes or skin necrosis: No Has patient had a PCN reaction that required hospitalization: No Has patient had a PCN reaction occurring within the last 10 years: No' If all of the above answers are "NO", then may proceed with Cephalosporin use.     Patient Measurements: Height: 5\' 6"  (167.6 cm) Weight: 276 lb (125.2 kg) IBW/kg (Calculated) : 63.8  Vital Signs: Temp: 98.4 F (36.9 C) (06/30 2226) Temp Source: Oral (06/30 2226) BP: 134/79 (06/30 2226) Pulse Rate: 96 (06/30 2226)  Labs: Recent Labs    07/17/17 1038  HGB 10.0*  HCT 34.0*  PLT 686*  LABPROT 21.1*  INR 1.84  CREATININE 1.01    Estimated Creatinine Clearance: 103.3 mL/min (by C-G formula based on SCr of 1.01 mg/dL).   Medical History: Past Medical History:  Diagnosis Date  . DVT (deep venous thrombosis) ([facility])     Medications:  Medications Prior to Admission  Medication Sig Dispense Refill Last Dose  . acetaminophen (TYLENOL) 500 MG tablet Take 500 mg by mouth every 6 (six) hours as needed.   Past Week at Unknown time  . busPIRone (BUSPAR) 7.5 MG tablet Take 15 mg by mouth 2 (two) times daily.  1 07/05/2017 at Unknown time  . cephALEXin (KEFLEX) 500 MG capsule Take 1 capsule (500 mg total) by mouth every 12 (twelve) hours for 12 days. 24 capsule 0   . cephALEXin (KEFLEX) 500 MG capsule Take 1 capsule (500 mg total) by mouth 2 (two) times daily. 28 capsule 0   . cyclobenzaprine (FLEXERIL) 5 MG tablet Take 5 mg by mouth 3 (three) times daily as needed for muscle spasms.  0 07/05/2017 at Unknown time  .  doxycycline (VIBRA-TABS) 100 MG tablet Take 1 tablet (100 mg total) by mouth every 12 (twelve) hours for 12 days. 24 tablet 0   . doxycycline (VIBRA-TABS) 100 MG tablet Take 1 tablet (100 mg total) by mouth 2 (two) times daily. 28 tablet 0   . enoxaparin (LOVENOX) 40 MG/0.4ML injection Inject 0.4 mLs into the skin See admin instructions. NIGHTLY FOR 10 DAYS STOP TAKING WHEN INR REACHES 2.0  0 07/01/2017 at 2.2  . EPINEPHrine (EPIPEN 2-PAK) 0.3 mg/0.3 mL IJ SOAJ injection Inject 1 pen into the muscle daily as needed for anaphylaxis.   prn at unk  . gabapentin (NEURONTIN) 300 MG capsule Take 300 mg by mouth 3 (three) times daily.   07/05/2017 at Unknown time  . oxyCODONE (OXY IR/ROXICODONE) 5 MG immediate release tablet Take 5-10 mg by mouth every 4 (four) hours as needed for pain.  0 prn at unk  . pantoprazole (PROTONIX) 40 MG tablet Take 40 mg by mouth daily.  3 07/05/2017 at Unknown time  . warfarin (COUMADIN) 5 MG tablet Take 10-15 mg by mouth See admin instructions. Daily based on INR reading  1 07/04/2017 at 1900   Scheduled:  . busPIRone  15 mg Oral BID  . cyclobenzaprine  5 mg Oral TID  . docusate sodium  100 mg Oral BID  . gabapentin  300 mg Oral TID  . pantoprazole  40 mg Oral Daily  . senna  1 tablet Oral BID    Assessment: 57yo male on Coumadin PTA for h/o VTE admitted for suspected chronic infection s/p TKR, awaiting further information from home ortho prior to surgical intervention.  Goal of Therapy:  INR 2-3   Plan:  Plan to hold Coumadin for now until ortho surgery plans are clear.  Vernard GamblesVeronda Macarius Ruark, PharmD, BCPS  07/18/2017,12:32 AM

## 2017-07-18 NOTE — Consult Note (Signed)
[facility]    Date of Admission:  07/17/2017     Total days of antibiotics 0               Reason for Consult: Prosthetic Joint Infection   Referring Provider: [facility] Primary Care Provider: System, Pcp Not In   Assessment/Plan:  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 CT with abscess and lucency around the hardware with concern for infection. He has been on doxycyline and keflex which confound cultures results which remain pending with no organisms on gram stain. Blood cultures have been negative and he has no systemic symptoms currently. Orthopedics working to determine surgical intervention as he lives in [place] and is considering surgery in [place]. Dr. Veverly Fells is working to determine transfer to a tertiary facility or returning to [facility]. He is not on antibiotics currently and has grown Serratia and Staph Lugdunensis on previous cultures. This is complicated pending timing of potential need for surgery and ability to obtain cultures as he has been on antibiotics.   1. Recommend continuing to monitor off antibiotics at this time as there are no current systemic symptoms or acute indications.  2. Consider aspiration of the knee to obtain cultures by either orthopedics or IR in the next 24-48 hours as he will be off antibiotics pending surgical plan.     Principal Problem:   Infection of prosthetic right knee joint ([place])   . busPIRone  15 mg Oral BID  . cyclobenzaprine  5 mg Oral TID  . docusate sodium  100 mg Oral BID  . gabapentin  300 mg Oral TID  . pantoprazole  40 mg Oral Daily  . senna  1 tablet Oral BID     HPI: Randy Figueroa is a 57 y.o. male with previous medical history of right prosthetic knee infection,  DVT, and obesity who was admitted to the hospital with the chief complaint of drainage from his right knee.  [name] had a total knee arthoplasty in June 2014. He has had multiple surgeries since that time including a 2 stage revision for with Staph lugdengensis in Summer 2015; Incision and drainage with Serratia infection in September 2015; Placement of antibiotic spacer in March 2019; and Knee arthrodesis in May of 2019. At his most recent office visit with orthopedics he continued to have a small amount of serous drainage from the distal aspect of the incision without erythema or other drainage. His treatment has been complicated by an upper extremity DVT secondary to a PICC line.   He was most recently admitted to [facility] and diagnosed with cellulitis where he was treated with vancomycin with blood cultures being negative. He was then transitioned to doxycycline and Keflex. Reports taking the medication as prescribed and notes improvement in symptoms with the oral antibiotics, however continues to have erythema over the anterior aspect of the knee with some swelling and purulent drainage. He has had no previous fevers, chills, calf pain or tenderness. Inflammatory markers are elevated with CRP of 7.1 and a ESR of 105. Wound cultures performed on 6/30 remain pending with gram stain showing no organisms. Blood cultures remain pending. CT imaging of the right knee with abscesses and probable tracking into the  fusion site as well as lucency around he intermedullary nail. He is not currently on antibiotics with his last dose of doxycyline and Keflex was yesterday prior to arrival at the hospital.   1. Recommend holding antibiotics pending plan for surgical intervention to guide treatment. 2. If treatment is necessary or symptoms worsen, recommend starting vancomycin and cefepime.  3. Continue to monitor cultures.   Review of Systems: Review of Systems  Constitutional: Negative for chills,  diaphoresis and fever.  Respiratory: Negative for cough, sputum production, shortness of breath and wheezing.   Cardiovascular: Negative for chest pain and leg swelling.  Gastrointestinal: Negative for abdominal pain, constipation, diarrhea, nausea and vomiting.  Musculoskeletal:       Positive for right knee drainage.      Past Medical History:  Diagnosis Date  . DVT (deep venous thrombosis) (HCC)     Social History   Tobacco Use  . Smoking status: Never Smoker  . Smokeless tobacco: Never Used  Substance Use Topics  . Alcohol use: Not on file  . Drug use: Not on file    No family history on file.  Allergies  Allergen Reactions  . Levofloxacin Anaphylaxis    Per Care Everywhere  . Amoxicillin Swelling    Has patient had a PCN reaction causing immediate rash, facial/tongue/throat swelling, SOB or lightheadedness with hypotension: Yes Has patient had a PCN reaction causing severe rash involving mucus membranes or skin necrosis: No Has patient had a PCN reaction that required hospitalization: No Has patient had a PCN reaction occurring within the last 10 years: No' If all of the above answers are "NO", then may proceed with Cephalosporin use.     OBJECTIVE: Blood pressure (!) 153/86, pulse 83, temperature 98.2 F (36.8 C), temperature source Oral, resp. rate 18, height 5' 6"  (1.676 m), weight 276 lb (125.2 kg), SpO2 99 %.  Physical Exam  Constitutional: He is oriented to person, place, and time. He appears well-developed and well-nourished. No distress.  Seated in bed with head elevated  Cardiovascular: Normal rate, regular rhythm, normal heart sounds and intact distal pulses. Exam reveals no gallop and no friction rub.  No murmur heard. Pulmonary/Chest: Effort normal and breath sounds normal.  Musculoskeletal:  Right knee with mild/moderate edema and erythema. No deformity. Previous scars appear approximated. There is serous drainage noted on the proximal and distal  aspect of the scar.There is warmth with no specific tenderness. Distal pulses and sensation are intact and appropriate.   Neurological: He is alert and oriented to person, place, and time.  Skin: Skin is warm and dry.  Psychiatric: He has a normal mood and affect. His behavior is normal. Judgment and thought content normal.    Lab Results Lab Results  Component Value Date   WBC 7.6 07/17/2017   HGB 10.0 (L) 07/17/2017   HCT 34.0 (L) 07/17/2017   MCV 85.4 07/17/2017   PLT 686 (H) 07/17/2017    Lab Results  Component Value Date   CREATININE 0.99 07/18/2017   BUN 14 07/18/2017   NA 138 07/18/2017   K 3.9 07/18/2017   CL 103 07/18/2017   CO2 28 07/18/2017    Lab Results  Component Value Date   ALT 13 07/18/2017   AST 15 07/18/2017   ALKPHOS 63 07/18/2017   BILITOT 0.5 07/18/2017     Microbiology: Recent Results (from the past 240 hour(s))  Aerobic Culture (superficial specimen)     Status: None (Preliminary result)   Collection Time: 07/17/17  1:09 PM  Result Value Ref Range Status   Specimen Description KNEE  Final   Special Requests NONE  Final   Gram Stain   Final    NO WBC SEEN NO ORGANISMS SEEN Performed at [facility], [address], [place], [place] [address]    Culture PENDING  Incomplete   Report Status PENDING  Incomplete  Surgical pcr screen     Status: None   Collection Time: 07/17/17 11:43 PM  Result Value Ref Range Status   MRSA, PCR NEGATIVE NEGATIVE Final   Staphylococcus aureus NEGATIVE NEGATIVE Final    Comment: (NOTE) The Xpert SA Assay (FDA approved for NASAL specimens in patients 40 years of age and older), is one component of a comprehensive surveillance program. It is not intended to diagnose infection nor to guide or monitor treatment. Performed at [facility], [place] [address], [place], [place] [address]      Terri Piedra, Uncertain for [place]  Pager  07/18/2017  10:17 AM

## 2017-07-19 ENCOUNTER — Inpatient Hospital Stay (HOSPITAL_COMMUNITY): Payer: Medicare Other

## 2017-07-19 ENCOUNTER — Encounter (HOSPITAL_COMMUNITY): Payer: Self-pay | Admitting: General Practice

## 2017-07-19 ENCOUNTER — Other Ambulatory Visit: Payer: Self-pay

## 2017-07-19 ENCOUNTER — Inpatient Hospital Stay: Payer: Self-pay

## 2017-07-19 DIAGNOSIS — B957 Other staphylococcus as the cause of diseases classified elsewhere: Secondary | ICD-10-CM

## 2017-07-19 DIAGNOSIS — Z7901 Long term (current) use of anticoagulants: Secondary | ICD-10-CM

## 2017-07-19 DIAGNOSIS — L02415 Cutaneous abscess of right lower limb: Secondary | ICD-10-CM

## 2017-07-19 DIAGNOSIS — B9689 Other specified bacterial agents as the cause of diseases classified elsewhere: Secondary | ICD-10-CM

## 2017-07-19 DIAGNOSIS — M8638 Chronic multifocal osteomyelitis, other site: Secondary | ICD-10-CM

## 2017-07-19 LAB — CBC
HEMATOCRIT: 33.4 % — AB (ref 39.0–52.0)
Hemoglobin: 10.2 g/dL — ABNORMAL LOW (ref 13.0–17.0)
MCH: 25.6 pg — ABNORMAL LOW (ref 26.0–34.0)
MCHC: 30.5 g/dL (ref 30.0–36.0)
MCV: 83.9 fL (ref 78.0–100.0)
Platelets: 540 10*3/uL — ABNORMAL HIGH (ref 150–400)
RBC: 3.98 MIL/uL — AB (ref 4.22–5.81)
RDW: 15.9 % — ABNORMAL HIGH (ref 11.5–15.5)
WBC: 7 10*3/uL (ref 4.0–10.5)

## 2017-07-19 LAB — PROTIME-INR
INR: 1.5
PROTHROMBIN TIME: 18 s — AB (ref 11.4–15.2)

## 2017-07-19 LAB — AEROBIC CULTURE  (SUPERFICIAL SPECIMEN)

## 2017-07-19 LAB — AEROBIC CULTURE W GRAM STAIN (SUPERFICIAL SPECIMEN): Gram Stain: NONE SEEN

## 2017-07-19 MED ORDER — VANCOMYCIN HCL 10 G IV SOLR
2500.0000 mg | Freq: Once | INTRAVENOUS | Status: AC
  Administered 2017-07-19: 2500 mg via INTRAVENOUS
  Filled 2017-07-19: qty 2500

## 2017-07-19 MED ORDER — MIDAZOLAM HCL 2 MG/2ML IJ SOLN
INTRAMUSCULAR | Status: AC
  Filled 2017-07-19: qty 2

## 2017-07-19 MED ORDER — FENTANYL CITRATE (PF) 100 MCG/2ML IJ SOLN
INTRAMUSCULAR | Status: AC
  Filled 2017-07-19: qty 2

## 2017-07-19 MED ORDER — WARFARIN SODIUM 7.5 MG PO TABS
20.0000 mg | ORAL_TABLET | ORAL | Status: AC
  Administered 2017-07-19: 20 mg via ORAL
  Filled 2017-07-19: qty 1

## 2017-07-19 MED ORDER — VANCOMYCIN HCL 10 G IV SOLR
1500.0000 mg | Freq: Two times a day (BID) | INTRAVENOUS | Status: DC
  Administered 2017-07-20 (×2): 1500 mg via INTRAVENOUS
  Filled 2017-07-19 (×2): qty 1500

## 2017-07-19 MED ORDER — WARFARIN - PHARMACIST DOSING INPATIENT: Freq: Every day | Status: DC

## 2017-07-19 MED ORDER — LIDOCAINE HCL (PF) 1 % IJ SOLN
INTRAMUSCULAR | Status: AC
  Filled 2017-07-19: qty 30

## 2017-07-19 MED ORDER — FENTANYL CITRATE (PF) 100 MCG/2ML IJ SOLN
INTRAMUSCULAR | Status: AC | PRN
  Administered 2017-07-19: 100 ug via INTRAVENOUS

## 2017-07-19 MED ORDER — MIDAZOLAM HCL 2 MG/2ML IJ SOLN
INTRAMUSCULAR | Status: AC | PRN
  Administered 2017-07-19: 2 mg via INTRAVENOUS

## 2017-07-19 MED ORDER — SODIUM CHLORIDE 0.9 % IV SOLN
INTRAVENOUS | Status: AC | PRN
  Administered 2017-07-19: 10 mL/h via INTRAVENOUS

## 2017-07-19 MED ORDER — SODIUM CHLORIDE 0.9% FLUSH: 10.0000 mL | INTRAVENOUS | Status: DC | PRN

## 2017-07-19 MED ORDER — VANCOMYCIN IV (FOR PTA / DISCHARGE USE ONLY): 1500.0000 mg | Freq: Two times a day (BID) | INTRAVENOUS | 0 refills | Status: AC

## 2017-07-19 NOTE — Progress Notes (Signed)
Family Medicine Teaching Service Daily Progress Note Intern Pager: [phone]  Patient name: Angelita InglesDavid A [place] Medical record number: [id number] Date of birth: 18-May-1960 Age: 57 y.o. Gender: male  Primary Care Provider: System, Pcp Not In Consultants: Orthopedics, Infections Disease Code Status: Full  Pt Overview and Major Events to Date:  6/18-20 - pt admitted for cellulitis of R knee 6/30 - pt readmitted for mucopurulent drainage from R knee  Assessment and Plan: 57 y/o man with multiple R knee surgeries presents with two sinus drainage sights on his right knee.  In the process of finding an appropriate orthopedic surgeon in [place].  Right knee pain w/ two dinus tracts w/ mucopurulent drainage H/O multiple R knee surgeries, now with drainage, warm and red around R knee, afebrile Pt has been seen by ortho and, due to complex orthopedic history, ortho is attempting to organize a physician handoff to an orthopedist in [place].   -No abx started per ID recs, may interfere w/ culture and aid abx resistance in a possibly seeded joint. -IR aspiration planned for 7/2 -pt NPO at midnight -f/u wound Cx and BCx - NGTD on both -Pt plans to leave this evening to travel to [place] if he cannot be transferred to [place] today or [facility]    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  Pt mentioned waking in the night with some perspiration. No additional new symptoms.  Pt plans to travel to [place] if [facility] or [facility].  Objective: Temp:  [97.8 F (36.6 C)-99.3 F (37.4 C)] 98.2 F (36.8 C) (07/02  0453) Pulse Rate:  [83-100] 83 (07/02 0453) Resp:  [14-16] 14 (07/02 0453) BP: (132-142)/(76-87) 136/87 (07/02 0453) SpO2:  [96 %-99 %] 96 % (07/02 0453) Physical Exam: General: Alert and cooperative and appears to be in no acute distress HEENT: Neck non-tender without lymphadenopathy, masses or thyromegaly Cardio: Normal A1 and S2, no S3 or S4. Rhythm is regular. No murmurs or rubs.   Pulm: Clear to auscultation bilaterally, no crackles, wheezing, or diminished breath sounds. Normal respiratory effort Abdomen: Bowel sounds normal. Abdomen soft and non-tender.  Extremities: Right knee appears swollen and erythematous, two draining sinuses (one superior to patella, one inferior), bandages had been changed earlier that morning and had 10-20cc of mucopurulent fluid.  R knee was tender to palpation and indurated.  R knee significantly swollen compared to unaffected L knee.  Laboratory: Recent Labs  Lab 07/17/17 1038 07/19/17 0409  WBC 7.6 7.0  HGB 10.0* 10.2*  HCT 34.0* 33.4*  PLT 686* 540*   Recent Labs  Lab 07/17/17 1038 07/18/17 0404  NA 140 138  K 3.4* 3.9  CL 103 103  CO2 25 28  BUN 18 14  CREATININE 1.01 0.99  CALCIUM 9.0 8.7*  PROT 7.7 6.6  BILITOT 0.4 0.5  ALKPHOS 71 63  ALT 14 13  AST 16 15  GLUCOSE 109* 113*    Imaging/Diagnostic Tests: CT knee 6/30: IMPRESSION: 1. Complex thick-walled fluid collections anterior to the fused knee joint, involving the subcutaneous tissues, vastus intermedius, rectus femoris, and vastus  medialis muscles and probably extending to the cutaneous surface. These collections are in direct contact with graft material and there is lack of bony bridging at the fusion site. There is also some lucency extending around the intramedullary nail both in the distal femur (causing scalloping of the endostium) and in the proximal tibia. The appearance is concerning for potential infection involving the anterior subcutaneous and  muscular tissues with abscesses, and probably tracking in the fusion site and along the adjacent portions of the intramedullary nail.  Mirian Mo, MD 07/19/2017, 7:38 AM PGY-1, [facility] FPTS Intern pager: [phone], text pages welcome

## 2017-07-19 NOTE — Progress Notes (Addendum)
ANTICOAGULATION CONSULT NOTE - Follo up  Pharmacy Consult for Coumadin Indication: h/o VTE  Allergies  Allergen Reactions  . Levofloxacin Anaphylaxis    Per Care Everywhere  . Amoxicillin Swelling    Has patient had a PCN reaction causing immediate rash, facial/tongue/throat swelling, SOB or lightheadedness with hypotension: Yes Has patient had a PCN reaction causing severe rash involving mucus membranes or skin necrosis: No Has patient had a PCN reaction that required hospitalization: No Has patient had a PCN reaction occurring within the last 10 years: No' If all of the above answers are "NO", then may proceed with Cephalosporin use.     Patient Measurements: Height: 5\' 6"  (167.6 cm) Weight: 276 lb (125.2 kg) IBW/kg (Calculated) : 63.8  Vital Signs: Temp: 98.7 F (37.1 C) (07/02 1611) Temp Source: Oral (07/02 1611) BP: 142/89 (07/02 1611) Pulse Rate: 101 (07/02 1611)  Labs: Recent Labs    07/17/17 1038 07/18/17 0404 07/19/17 0409  HGB 10.0*  --  10.2*  HCT 34.0*  --  33.4*  PLT 686*  --  540*  LABPROT 21.1*  --  18.0*  INR 1.84  --  1.50  CREATININE 1.01 0.99  --     Estimated Creatinine Clearance: 105.4 mL/min (by C-G formula based on SCr of 0.99 mg/dL).   Medical History: Past Medical History:  Diagnosis Date  . Anxiety   . Arthritis    "knees" (07/19/2017)  . Cellulitis of right leg 06/2017  . Chronic lower back pain    "L5-S1; I've had injections"  . DVT (deep venous thrombosis) (HCC) ~ 04/2017   RUE "when PICC was removed"  . Family history of adverse reaction to anesthesia    "mother has hard time coming out of it; gets PONV" (07/19/2017)  . GERD (gastroesophageal reflux disease)   . History of blood transfusion    "I believe I have had one; related to one of the knee ORs" (07/19/2017)    Medications:  Medications Prior to Admission  Medication Sig Dispense Refill Last Dose  . acetaminophen (TYLENOL) 500 MG tablet Take 500 mg by mouth every 6 (six)  hours as needed.   Past Week at Unknown time  . busPIRone (BUSPAR) 7.5 MG tablet Take 15 mg by mouth 2 (two) times daily.  1 07/05/2017 at Unknown time  . cephALEXin (KEFLEX) 500 MG capsule Take 1 capsule (500 mg total) by mouth 2 (two) times daily. 28 capsule 0   . cyclobenzaprine (FLEXERIL) 5 MG tablet Take 5 mg by mouth 3 (three) times daily as needed for muscle spasms.  0 07/05/2017 at Unknown time  . doxycycline (VIBRA-TABS) 100 MG tablet Take 1 tablet (100 mg total) by mouth 2 (two) times daily. 28 tablet 0   . EPINEPHrine (EPIPEN 2-PAK) 0.3 mg/0.3 mL IJ SOAJ injection Inject 1 pen into the muscle daily as needed for anaphylaxis.   prn at unk  . gabapentin (NEURONTIN) 300 MG capsule Take 300 mg by mouth 3 (three) times daily.   07/05/2017 at Unknown time  . oxyCODONE (OXY IR/ROXICODONE) 5 MG immediate release tablet Take 5-10 mg by mouth every 4 (four) hours as needed for pain.  0 prn at unk  . pantoprazole (PROTONIX) 40 MG tablet Take 40 mg by mouth daily.  3 07/05/2017 at Unknown time  . warfarin (COUMADIN) 5 MG tablet Take 10-15 mg by mouth See admin instructions. Daily based on INR reading  1 07/04/2017 at 1900  . enoxaparin (LOVENOX) 40 MG/0.4ML injection Inject 0.4  mLs into the skin See admin instructions. NIGHTLY FOR 10 DAYS STOP TAKING WHEN INR REACHES 2.0  0 Completed Course at Unknown time   Scheduled:  . busPIRone  15 mg Oral BID  . cyclobenzaprine  5 mg Oral TID  . docusate sodium  100 mg Oral BID  . fentaNYL      . gabapentin  300 mg Oral TID  . lidocaine (PF)      . midazolam      . pantoprazole  40 mg Oral Daily  . 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   S/p  right knee fluid collection aspiration today by IR, end time 14:32.  No surgery planned here. MD has resumed consulted pharmacy to resume warfarin tonight.  FMTS MD said no heparin/lmwh needed. PTA warfarin dose:  Patient was alternating between 10 mg + 15 mg daily with therapeutic INR.  INR increased prior to admit due to drug interaction with antibiotic. I received report that his Coumadin dose was held x1 day and INR decreased to subtherapeutic 1.84 on 6/30. Today INR is 1.50.  Pltc >normal, Hgb 10.2 stable. No bleeding noted.   Goal of Therapy:  INR 2-3   Plan:  Resume Warfarin tonight. Give warfarin 20 mg po x1 Daily Protime/INR  Thank you for allowing pharmacy to be part of this patients care team.  Noah Delaine, RPh Clinical Pharmacist Please check AMION for all [facility] numbers After 10 pm call Main pharmacy [phone]  07/19/2017,6:53 PM

## 2017-07-19 NOTE — Progress Notes (Signed)
Orthopedics Progress Note  Subjective: Awaiting guided aspiration this morning  Objective:  Vitals:   07/18/17 2100 07/19/17 0453  BP: (!) 142/76 136/87  Pulse: 96 83  Resp: 15 14  Temp: 97.8 F (36.6 C) 98.2 F (36.8 C)  SpO2: 99% 96%    General: Awake and alert  Musculoskeletal: right knee with persistent erythema and drainage. Compartments supple Neurovascularly intact  Lab Results  Component Value Date   WBC 7.0 07/19/2017   HGB 10.2 (L) 07/19/2017   HCT 33.4 (L) 07/19/2017   MCV 83.9 07/19/2017   PLT 540 (H) 07/19/2017       Component Value Date/Time   NA 138 07/18/2017 0404   K 3.9 07/18/2017 0404   CL 103 07/18/2017 0404   CO2 28 07/18/2017 0404   GLUCOSE 113 (H) 07/18/2017 0404   BUN 14 07/18/2017 0404   CREATININE 0.99 07/18/2017 0404   CALCIUM 8.7 (L) 07/18/2017 0404   GFRNONAA >60 07/18/2017 0404   GFRAA >60 07/18/2017 0404    Lab Results  Component Value Date   INR 1.50 07/19/2017   INR 1.84 07/17/2017   INR 3.9 (A) 07/11/2017    Assessment/Plan: 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 Awaiting aspiration then will restart suppressive abx per ID. I have spoken with Dr Dannielle HuhSteve Lucey who recommended checking with [facility] Jena [facility]Haddix [facility] at [facility] for his opinion and if he thinks that surgery can be performed here or if patient needs to head back up to primary ortho in [place]. Recommend getting imaging on disc in anticipation of travel  Start L LE TED, SCD  Almedia BallsSteven R. Ranell PatrickNorris, MD 07/19/2017 8:00 AM

## 2017-07-19 NOTE — Consult Note (Addendum)
Chief Complaint: Patient was seen in consultation today for right knee fluid collection aspiration at the request of Dr Beverely LowSteve Norris   Supervising Physician: Oley BalmHassell, Daniel  [facility]  History of Present Illness: Randy InglesDavid A St Pierre is a 57 y.o. 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 Complex thick-walled fluid collections anterior to the fused knee joint, involving the subcutaneous tissues, vastus intermedius, rectus femoris, and vastus medialis muscles and probably extending to the cutaneous surface. These collections are in direct contact with graft material and there is lack of bony bridging at the fusion site. There is also some lucency extending around the intramedullary nail both in the distal femur (causing scalloping of the endostium) and in the proximal tibia. The appearance is concerning for potential infection involving the anterior subcutaneous and muscular tissues with abscesses, and probably tracking in the fusion site and along the adjacent portions of the intramedullary nail.  Imaging reviewed by Dr Grace IsaacWatts-- approves procedure  Past Medical History:  Diagnosis Date  . DVT (deep venous thrombosis) (HCC)     Past Surgical History:  Procedure Laterality Date  . KNEE SURGERY      Allergies: Levofloxacin and Amoxicillin  Medications: Prior to Admission medications   Medication Sig Start Date End Date Taking? Authorizing Provider  acetaminophen (TYLENOL) 500 MG tablet Take 500 mg by mouth every 6 (six) hours as needed.    [provider]  busPIRone (BUSPAR) 7.5 MG tablet Take 15 mg by mouth 2 (two) times daily. 06/20/17   [provider]  cephALEXin (KEFLEX) 500 MG capsule  Take 1 capsule (500 mg total) by mouth every 12 (twelve) hours for 12 days. 07/07/17 07/19/17  Leland HerYoo, Elsia J, DO  cephALEXin (KEFLEX) 500 MG capsule Take 1 capsule (500 mg total) by mouth 2 (two) times daily. 07/11/17   Casey BurkittFitzgerald, Hillary Moen, MD  cyclobenzaprine (FLEXERIL) 5 MG tablet Take 5 mg by mouth 3 (three) times daily as needed for muscle spasms. 06/29/17   [provider]  doxycycline (VIBRA-TABS) 100 MG tablet Take 1 tablet (100 mg total) by mouth every 12 (twelve) hours for 12 days. 07/07/17 07/19/17  Leland HerYoo, Elsia J, DO  doxycycline (VIBRA-TABS) 100 MG tablet Take 1 tablet (100 mg total) by mouth 2 (two) times daily. 07/11/17   Casey BurkittFitzgerald, Hillary Moen, MD  enoxaparin (LOVENOX) 40 MG/0.4ML injection Inject 0.4 mLs into the skin See admin instructions. NIGHTLY FOR 10 DAYS STOP TAKING WHEN INR REACHES 2.0 06/20/17   [provider]  EPINEPHrine (EPIPEN 2-PAK) 0.3 mg/0.3 mL IJ SOAJ injection Inject 1 pen into the muscle daily as needed for anaphylaxis.    [provider]  gabapentin (NEURONTIN) 300 MG capsule Take 300 mg by mouth 3 (three) times daily.    [provider]  oxyCODONE (OXY IR/ROXICODONE) 5 MG immediate release tablet Take 5-10 mg by mouth every 4 (four) hours as needed for pain. 06/06/17   [provider]  pantoprazole (PROTONIX) 40 MG tablet Take 40 mg by mouth daily. 06/21/17   [provider]  warfarin (COUMADIN) 5 MG tablet Take 10-15 mg by mouth See admin instructions. Daily based on INR reading 06/06/17   [provider]     No family history on  file.  Social History   Socioeconomic History  . Marital status: Married    Spouse name: Not on file  . Number of children: Not on file  . Years of education: Not on file  . Highest education level: Not on file  Occupational History  . Not on file  Social Needs  . Financial resource strain: Not on file  . Food insecurity:    Worry: Not on file    Inability: Not on file  .  Transportation needs:    Medical: Not on file    Non-medical: Not on file  Tobacco Use  . Smoking status: Never Smoker  . Smokeless tobacco: Never Used  Substance and Sexual Activity  . Alcohol use: Not on file  . Drug use: Not on file  . Sexual activity: Not on file  Lifestyle  . Physical activity:    Days per week: Not on file    Minutes per session: Not on file  . Stress: Not on file  Relationships  . Social connections:    Talks on phone: Not on file    Gets together: Not on file    Attends religious service: Not on file    Active member of club or organization: Not on file    Attends meetings of clubs or organizations: Not on file    Relationship status: Not on file  Other Topics Concern  . Not on file  Social History Narrative  . Not on file    Review of Systems: A 12 point ROS discussed and pertinent positives are indicated in the HPI above.  All other systems are negative.  Review of Systems  Constitutional: Positive for activity change. Negative for appetite change, fatigue and fever.  Respiratory: Negative for cough and shortness of breath.   Cardiovascular: Negative for chest pain.  Gastrointestinal: Negative for abdominal pain.  Musculoskeletal: Positive for gait problem.  Skin: Positive for wound.  Psychiatric/Behavioral: Negative for behavioral problems and confusion.    Vital Signs: BP 136/87 (BP Location: Right Arm)   Pulse 83   Temp 98.2 F (36.8 C) (Oral)   Resp 14   Ht 5\' 6"  (1.676 m)   Wt 276 lb (125.2 kg)   SpO2 96%   BMI 44.55 kg/m   Physical Exam  Constitutional: He is oriented to person, place, and time.  Cardiovascular: Normal rate, regular rhythm and normal heart sounds.  Pulmonary/Chest: Effort normal and breath sounds normal.  Abdominal: Soft. Bowel sounds are normal.  Musculoskeletal: Normal range of motion.  Rt knee swelling Skin redness; weeping wound  Neurological: He is alert and oriented to person, place, and time.  Skin:  Skin is warm and dry. There is erythema.  Psychiatric: He has a normal mood and affect. His behavior is normal. Judgment and thought content normal.  Nursing note and vitals reviewed.   Imaging: Dg Tibia/fibula Right  Result Date: 07/05/2017 CLINICAL DATA:  57 year old male status post surgery to the right lower extremity 4 months ago. Right knee infection x1 month. EXAM: RIGHT TIBIA AND FIBULA - 2 VIEW COMPARISON:  Right femur series today reported separately. FINDINGS: The distal aspect of the right lower extremity intramedullary rod terminates at the distal tibia metadiaphysis with 2 interlocking cortical screws. The hardware appears intact without suspicious lucency. The right fibula appears intact. Right mortise joint alignment is preserved. Osseous irregularity about the eroded right knee joint as detailed on the femur series today. No soft tissue gas identified. IMPRESSION: 1. The distal right  lower extremity intramedullary rod and interlocking hardware appears intact without lucency. 2. Chronic appearing bone destruction at the right knee with nonspecific dystrophic calcifications and absent arthrodesis as described on the femur series today. Electronically Signed   By: Odessa Fleming M.D.   On: 07/05/2017 19:34   [place] Rt Lower Extrem Ltd Soft Tissue Non Vascular  Result Date: 07/07/2017 CLINICAL DATA:  Fluid collection around right knee. Multiple prior surgeries. EXAM: ULTRASOUND RIGHT LOWER EXTREMITY LIMITED TECHNIQUE: Ultrasound examination of the lower extremity soft tissues was performed in the area of clinical concern. COMPARISON:  07/05/2017. FINDINGS: Laterally there is a 12.4 x 5.6 x 7.4 cm complex fluid collection versus solid process is noted. Associated calcific densities are present. Medially there is a 9.5 x 4.6 x 6.7 cm. IMPRESSION: 1. Laterally there is a 12.4 x 5.6 x 7.4 cm complex fluid collection versus solid process. Associated calcific densities are present. 2.  Medially there is a  9.5 x 4.6 x 6.7 cm fluid collection. Electronically Signed   By: Maisie Fus  Register   On: 07/07/2017 12:17   Ct Extremity Lower Right W Contrast  Result Date: 07/17/2017 CLINICAL DATA:  Cellulitis in the vicinity of the right knee, serosanguineous drainage and blistering, fusion of the right knee on March 8th 2019 EXAM: CT OF THE LOWER RIGHT EXTREMITY WITH CONTRAST TECHNIQUE: Multidetector CT imaging of the lower right extremity was performed from the mid thigh through the mid tibia according to the standard protocol following intravenous contrast administration. COMPARISON:  Radiographs dated 07/05/2017 CONTRAST:  OMNIPAQUE IOHEXOL 300 MG/ML  SOLN FINDINGS: Bones/Joint/Cartilage The patient has an intramedullary nail extending from the proximal femur to the distal tibia, traversing the fused knee joint, and a significant portion of this nail is included on today's CT scan. There is some periosteal reaction in the distal femur along with some lucency scalloping the posteromedial and ostium of the distal femur shown for example on image 39/4. Multiple bone graft fragments are present in the vicinity of the resected knee joint, without bony bridging at this time. Lucency surrounds much of the tibial component of the intramedullary nail proximally but not distally. Ligaments Suboptimally assessed by CT. Muscles and Tendons Multiple complex fluid collections are present anterior to the fused knee joint, involving both the subcutaneous tissues and also the vastus intermedius, rectus femoris,, and vastus medialis muscles. There is some atrophy of the distal vastus lateralis. A more superficial collection with thick margins measures about 10.9 by 7.2 by 3.3 cm (volume = 140 cm^3) and appears to communicate with the anterior cutaneous margin, likely contributing to the weeping wound. A more cephalad and medial component is somewhat multilocular and measures 4.9 by 3.9 by 6.4 cm (volume = 64 cm^3), and involves the  vastus musculature and also may be contributing to the cutaneous drainage. Notable effacement of surrounding muscular tissues and soft tissue irregularity in this vicinity. I do not see gas within these collections. The collections do extend back to the bone graft margins, for example on image 74/5, and involvement of the fusion site is not excluded. Soft tissues The soft tissues are discussed in conjunction with the muscle/tendon section above. IMPRESSION: 1. Complex thick-walled fluid collections anterior to the fused knee joint, involving the subcutaneous tissues, vastus intermedius, rectus femoris, and vastus medialis muscles and probably extending to the cutaneous surface. These collections are in direct contact with graft material and there is lack of bony bridging at the fusion site. There is also some lucency  extending around the intramedullary nail both in the distal femur (causing scalloping of the endostium) and in the proximal tibia. The appearance is concerning for potential infection involving the anterior subcutaneous and muscular tissues with abscesses, and probably tracking in the fusion site and along the adjacent portions of the intramedullary nail. Electronically Signed   By: Gaylyn Rong M.D.   On: 07/17/2017 14:12   Dg Femur, Min 2 Views Right  Result Date: 07/05/2017 CLINICAL DATA:  57 year old male status post surgery to the right lower extremity 4 months ago. Right knee infection x1 month. EXAM: RIGHT FEMUR 2 VIEWS COMPARISON:  None. FINDINGS: An intramedullary rod is in place from the right femur intertrochanteric segment extending distally through the right knee and into the tibia. There are 2 proximal interlocking cortical screws. The proximal right femur and proximal hardware appear intact. The right hip joint appears normal. Intact visible pelvis. Chronic appearing destructive changes at the right knee joint with apparent bone packing material, and surrounding dystrophic soft  tissue calcifications. No right knee arthrodesis suspected. No suspicious lucency identified along the course of the intramedullary rod through to the proximal tibia. No acute fracture identified. No soft tissue gas identified. IMPRESSION: 1. Intramedullary rod in place from the right femur intertrochanteric segment distally into the right tibia appears intact without evidence of loosening. 2. No right knee joint arthrodesis suspected. Chronic appearing bone destruction about the knee with nonspecific dystrophic calcifications. Electronically Signed   By: Odessa Fleming M.D.   On: 07/05/2017 19:30    Labs:  CBC: Recent Labs    07/06/17 0543 07/07/17 0555 07/17/17 1038 07/19/17 0409  WBC 8.7 9.6 7.6 7.0  HGB 9.5* 10.7* 10.0* 10.2*  HCT 31.2* 35.1* 34.0* 33.4*  PLT 281 350 686* 540*    COAGS: Recent Labs    07/07/17 0555 07/11/17 1340 07/17/17 1038 07/19/17 0409  INR 2.35 3.9* 1.84 1.50    BMP: Recent Labs    07/06/17 0543 07/07/17 0555 07/17/17 1038 07/18/17 0404  NA 139 140 140 138  K 3.6 3.6 3.4* 3.9  CL 104 102 103 103  CO2 28 26 25 28   GLUCOSE 114* 106* 109* 113*  BUN 13 12 18 14   CALCIUM 8.7* 9.1 9.0 8.7*  CREATININE 0.99 1.02 1.01 0.99  GFRNONAA >60 >60 >60 >60  GFRAA >60 >60 >60 >60    LIVER FUNCTION TESTS: Recent Labs    07/05/17 1247 07/17/17 1038 07/18/17 0404  BILITOT 0.4 0.4 0.5  AST 14* 16 15  ALT 14* 14 13  ALKPHOS 92 71 63  PROT 7.7 7.7 6.6  ALBUMIN 3.5 3.3* 2.9*    TUMOR MARKERS: No results for input(s): AFPTM, CEA, CA199, CHROMGRNA in the last 8760 hours.  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  All of the patient's questions were answered, patient is agreeable to  proceed. Consent signed and in chart.    Thank you for this interesting consult.  I greatly enjoyed meeting Jude Naclerio and look forward to participating in their care.  A copy of this report was sent to the requesting provider on this date.  Electronically Signed: Robet Leu, PA-C 07/19/2017, 6:48 AM   I spent a total of 40 Minutes    in face to  face in clinical consultation, greater than 50% of which was counseling/coordinating care for right knee fluid collection aspiration

## 2017-07-19 NOTE — Progress Notes (Addendum)
Randy Figueroa for Infectious Disease  Date of Admission:  07/17/2017             ASSESSMENT/PLAN  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 He underwent aspiration through IR today with gram stain showing no organisms. 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 The current plan is to place a PICC line and treat him with Vancomycin for potential Staph Lugdunensis infection while awaiting the culture results. 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 The vancomycin should provide ample time to work with our orthopedic team to continue the process of finding a surgical consult. His first choice right now is to stay and have procedures done here in [place] if able to find the appropriate surgeon.    1. Start vancomycin per pharmacy consult.   2. Place PICC. 3. Continue to monitor cultures.  4. OPAT orders placed.    Diagnosis: Prosthetic Joint Infection / Osteomyelitis   Culture Result: Pending   Allergies  Allergen Reactions  . Levofloxacin Anaphylaxis    Per Care Everywhere  . Amoxicillin Swelling    Has patient had a PCN reaction causing immediate rash, facial/tongue/throat swelling, SOB or lightheadedness with hypotension: Yes Has patient had a PCN reaction causing severe rash involving mucus membranes or skin necrosis: No Has patient had a PCN reaction that required hospitalization: No Has patient had a PCN reaction occurring within the last 10 years: No' If all of the above answers are "NO", then may proceed with Cephalosporin use.     OPAT Orders Discharge antibiotics: Vancomycin  Per pharmacy  protocol  Aim for Vancomycin trough 15-20 (unless otherwise indicated) Duration:  6 weeks   End Date:  08/30/17  [facility] Care Per Protocol:  Labs weekly while on IV antibiotics: _X_ CBC with differential __ BMP _X_ CMP __ CRP __ ESR _X_ Vancomycin trough  __ Please pull PIC at completion of IV antibiotics X__ Please leave PIC in place until doctor has seen patient or been notified  Fax weekly labs to [phone]  Clinic Follow Up Appt:  3-4 weeks with Dr. Megan Salon or Terri Piedra, NP   Principal Problem:   Infection of prosthetic right knee joint ([place])   . busPIRone  15 mg Oral BID  . cyclobenzaprine  5 mg Oral TID  . docusate sodium  100 mg Oral BID  . gabapentin  300 mg Oral TID  . pantoprazole  40 mg Oral Daily  . senna  1 tablet Oral BID    SUBJECTIVE:  Afebrile. Not currently on antibiotic therapy. Blood cultures with no growth to date. Awaiting aspiration by IR. Disposition remains pending.    Allergies  Allergen Reactions  . Levofloxacin Anaphylaxis    Per Care Everywhere  . Amoxicillin Swelling    Has patient had a PCN reaction causing immediate rash, facial/tongue/throat swelling, SOB or lightheadedness with hypotension: Yes Has patient had a PCN reaction causing severe rash involving mucus membranes or skin necrosis: No Has patient had a PCN reaction that required hospitalization: No Has patient had a PCN reaction occurring within the last 10 years: No' If all of the above answers  are "NO", then may proceed with Cephalosporin use.      Review of Systems: Review of Systems  Constitutional: Negative for chills, diaphoresis and fever.  Respiratory: Negative for cough, shortness of breath and wheezing.   Cardiovascular: Negative for chest pain and leg swelling.  Gastrointestinal: Negative for abdominal pain, constipation, diarrhea, nausea and vomiting.    OBJECTIVE: Vitals:   07/18/17 0352 07/18/17 1422 07/18/17 2100 07/19/17 0453  BP: (!)  153/86 132/82 (!) 142/76 136/87  Pulse: 83 100 96 83  Resp: _0 Temp: 98.2 F (36.8 C) 99.3 F (37.4 C) 97.8 F (36.6 C) 98.2 F (36.8 C)  TempSrc: Oral Oral Oral Oral  SpO2: 99% 98% 99% 96%  Weight:      Height:       Body mass index is 44.55 kg/m.  Physical Exam  Constitutional: He is oriented to person, place, and time. He appears well-developed and well-nourished. No distress.  Seated in bed with head elevated  Cardiovascular: Normal rate, regular rhythm, normal heart sounds and intact distal pulses. Exam reveals no gallop and no friction rub.  No murmur heard. Pulmonary/Chest: Effort normal and breath sounds normal. No stridor. No respiratory distress. He has no wheezes. He has no rales. He exhibits no tenderness.  Abdominal: Soft. Bowel sounds are normal.  Musculoskeletal:  Serous drainage near the distal aspect of the anterior knee. Proximal site with less drainage today.   Neurological: He is alert and oriented to person, place, and time.  Skin: Skin is warm and dry.  Psychiatric: He has a normal mood and affect.    Lab Results Lab Results  Component Value Date   WBC 7.0 07/19/2017   HGB 10.2 (L) 07/19/2017   HCT 33.4 (L) 07/19/2017   MCV 83.9 07/19/2017   PLT 540 (H) 07/19/2017    Lab Results  Component Value Date   CREATININE 0.99 07/18/2017   BUN 14 07/18/2017   NA 138 07/18/2017   K 3.9 07/18/2017   CL 103 07/18/2017   CO2 28 07/18/2017    Lab Results  Component Value Date   ALT 13 07/18/2017   AST 15 07/18/2017   ALKPHOS 63 07/18/2017   BILITOT 0.5 07/18/2017     Microbiology: Recent Results (from the past 240 hour(s))  Aerobic Culture (superficial specimen)     Status: None (Preliminary result)   Collection Time: 07/17/17  1:09 PM  Result Value Ref Range Status   Specimen Description KNEE  Final   Special Requests NONE  Final   Gram Stain   Final    NO WBC SEEN NO ORGANISMS SEEN Performed at [facility], [place] [address] [place], [place] [address]    Culture FEW GRAM NEGATIVE RODS  Final   Report Status PENDING  Incomplete  Blood culture (routine x 2)     Status: None (Preliminary result)   Collection Time: 07/17/17  3:12 PM  Result Value Ref Range Status   Specimen Description BLOOD RIGHT ANTECUBITAL  Final   Special Requests   Final    BOTTLES DRAWN AEROBIC ONLY Blood Culture results may not be optimal due to an inadequate volume of blood received in culture bottles   Culture   Final    NO GROWTH < 24 HOURS Performed at [facility], [place] [address], [place], [place] [address]    Report Status PENDING  Incomplete  Blood culture (routine x 2)     Status: None (Preliminary result)  Collection Time: 07/17/17  3:12 PM  Result Value Ref Range Status   Specimen Description BLOOD RIGHT ANTECUBITAL  Final   Special Requests   Final    BOTTLES DRAWN AEROBIC AND ANAEROBIC Blood Culture adequate volume   Culture   Final    NO GROWTH < 24 HOURS Performed at [facility], [address], [place], [place] [address]    Report Status PENDING  Incomplete  Surgical pcr screen     Status: None   Collection Time: 07/17/17 11:43 PM  Result Value Ref Range Status   MRSA, PCR NEGATIVE NEGATIVE Final   Staphylococcus aureus NEGATIVE NEGATIVE Final    Comment: (NOTE) The Xpert SA Assay (FDA approved for NASAL specimens in patients 107 years of age and older), is one component of a comprehensive surveillance program. It is not intended to diagnose infection nor to guide or monitor treatment. Performed at [facility], [place] [address], [place], [place] [address]      Terri Piedra, [place] for [place] Pager  07/19/2017  11:51 AM

## 2017-07-19 NOTE — Procedures (Signed)
  Procedure: US aspiration anterior tibial collection 2ml purulent EBL:   minimal Complications:  none immediate  See full dictation in [facility] PACS.  Thora Lance. Maevis Mumby MD Main # [phone] Pager  [phone]

## 2017-07-19 NOTE — Progress Notes (Signed)
Peripherally Inserted Central Catheter/Midline Placement  The IV Nurse has discussed with the patient and/or persons authorized to consent for the patient, the purpose of this procedure and the potential benefits and risks involved with this procedure.  The benefits include less needle sticks, lab draws from the catheter, and the patient may be discharged home with the catheter. Risks include, but not limited to, infection, bleeding, blood clot (thrombus formation), and puncture of an artery; nerve damage and irregular heartbeat and possibility to perform a PICC exchange if needed/ordered by physician.  Alternatives to this procedure were also discussed.  Bard Power PICC patient education guide, fact sheet on infection prevention and patient information card has been provided to patient /or left at bedside.    PICC/Midline Placement Documentation  PICC Single Lumen 07/19/17 PICC Left Brachial 46 cm 1 cm (Active)  Indication for Insertion or Continuance of Line Prolonged intravenous therapies 07/19/2017  7:27 PM  Exposed Catheter (cm) 1 cm 07/19/2017  7:27 PM  Site Assessment Clean;Dry;Intact 07/19/2017  7:27 PM  Line Status Saline locked;Flushed;Blood return noted 07/19/2017  7:27 PM  Dressing Type Transparent 07/19/2017  7:27 PM  Dressing Status Clean;Dry;Intact;Antimicrobial disc in place 07/19/2017  7:27 PM  Dressing Intervention New dressing 07/19/2017  7:27 PM  Dressing Change Due 07/26/17 07/19/2017  7:27 PM       Jarian Longoria, Lajean ManesKerry Loraine 07/19/2017, 7:27 PM

## 2017-07-19 NOTE — Progress Notes (Signed)
Patient is in IR for aspiration of right knee wound

## 2017-07-19 NOTE — Progress Notes (Signed)
Returns from IR- dressing changed to the right knee on both sites.  Small amount of yellowish drainage.   Ted hose are in place.  SCD's are unavailable at this time when ordered.

## 2017-07-19 NOTE — Progress Notes (Signed)
IV team called- planning to be on the unit in approximately 30 minutes.  Aware of need to start IV Vancomycin

## 2017-07-19 NOTE — Progress Notes (Signed)
Pharmacy Antibiotic Note  Randy FlowersDavid A St Alla Germanierre is a 57 y.o. male admitted on 07/17/2017 with chronic right knee cellulitis/joint infection with mucopurulent drainage.   Pharmacy has been consulted for Vancomycin dosing for prosthetic joint infection.  Plan: Vancomycin 2500 mg IV x1 now then 1500 mg IV q12h Monitor renal function, culture results and steady state vancomycin trough.  Height: 5\' 6"  (167.6 cm) Weight: 276 lb (125.2 kg) IBW/kg (Calculated) : 63.8  Temp (24hrs), Avg:98.1 F (36.7 C), Min:97.8 F (36.6 C), Max:98.4 F (36.9 C)  Recent Labs  Lab 07/17/17 1038 07/17/17 1052 07/18/17 0404 07/19/17 0409  WBC 7.6  --   --  7.0  CREATININE 1.01  --  0.99  --   LATICACIDVEN  --  1.48  --   --     Estimated Creatinine Clearance: 105.4 mL/min (by C-G formula based on SCr of 0.99 mg/dL).    Allergies  Allergen Reactions  . Levofloxacin Anaphylaxis    Per Care Everywhere  . Amoxicillin Swelling    Has patient had a PCN reaction causing immediate rash, facial/tongue/throat swelling, SOB or lightheadedness with hypotension: Yes Has patient had a PCN reaction causing severe rash involving mucus membranes or skin necrosis: No Has patient had a PCN reaction that required hospitalization: No Has patient had a PCN reaction occurring within the last 10 years: No' If all of the above answers are "NO", then may proceed with Cephalosporin use.     Antimicrobials this admission: Vancomycin 7/2>>  Dose adjustments this admission:   Microbiology results:   6/30 knee culture: enterobacter: pan sens except R to cefazolin 6/30  BCx: ngtd 7/2 wound Cx: pending 6/30  MRSA PCR: negative  Thank you for allowing pharmacy to be a part of this patient's care. Noah Delaineuth Zahraa Bhargava, RPh Clinical Pharmacist Pager: [phone] Please check AMION for all [facility] numbers After 10 pm call main pharmacy [phone] 07/19/2017 3:46 PM

## 2017-07-19 NOTE — Progress Notes (Signed)
IV team member present for placement of PICC line

## 2017-07-20 DIAGNOSIS — M869 Osteomyelitis, unspecified: Secondary | ICD-10-CM | POA: Insufficient documentation

## 2017-07-20 DIAGNOSIS — Z95828 Presence of other vascular implants and grafts: Secondary | ICD-10-CM

## 2017-07-20 LAB — CBC
HCT: 31.7 % — ABNORMAL LOW (ref 39.0–52.0)
Hemoglobin: 9.8 g/dL — ABNORMAL LOW (ref 13.0–17.0)
MCH: 25.5 pg — AB (ref 26.0–34.0)
MCHC: 30.9 g/dL (ref 30.0–36.0)
MCV: 82.6 fL (ref 78.0–100.0)
PLATELETS: 497 10*3/uL — AB (ref 150–400)
RBC: 3.84 MIL/uL — AB (ref 4.22–5.81)
RDW: 15.6 % — ABNORMAL HIGH (ref 11.5–15.5)
WBC: 7.2 10*3/uL (ref 4.0–10.5)

## 2017-07-20 LAB — BASIC METABOLIC PANEL
ANION GAP: 8 (ref 5–15)
BUN: 12 mg/dL (ref 6–20)
CO2: 25 mmol/L (ref 22–32)
Calcium: 8.7 mg/dL — ABNORMAL LOW (ref 8.9–10.3)
Chloride: 106 mmol/L (ref 98–111)
Creatinine, Ser: 0.93 mg/dL (ref 0.61–1.24)
GFR calc Af Amer: >60 mL/min (ref >60)
GFR calc non Af Amer: >60 mL/min (ref >60)
GLUCOSE: 105 mg/dL — AB (ref 70–99)
POTASSIUM: 3.7 mmol/L (ref 3.5–5.1)
Sodium: 139 mmol/L (ref 135–145)

## 2017-07-20 LAB — PROTIME-INR
INR: 1.29
Prothrombin Time: 16 seconds — ABNORMAL HIGH (ref 11.4–15.2)

## 2017-07-20 MED ORDER — WARFARIN SODIUM 7.5 MG PO TABS: 15.0000 mg | ORAL_TABLET | Freq: Once | ORAL | Status: DC

## 2017-07-20 NOTE — Discharge Summary (Signed)
[facility] Discharge Summary  Patient name: Randy Figueroa Medical record number: [id number] Date of birth: 07-Jul-1960 Age: 57 y.o. Gender: male Date of Admission: 07/17/2017  Date of Discharge: 07/20/2017 Admitting Physician: Lind Covert, MD  Primary Care Provider: System, Pcp Not In Consultants: Orthopedics, Infectious disease  Indication for Hospitalization: Mucopurulent drainage from surgical site  Discharge Diagnoses/Problem List:  Patient Active Problem List   Diagnosis Date Noted  . Infection of prosthetic right knee joint ([place]) 07/17/2017  . 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 Cellulitis 07/05/2017     Disposition: Discharge to home with home health and close follow up with orthopedics and infectious disease  Discharge Condition: stable  Discharge Exam:  General: Alert and cooperative and appears to be in no acute distress HEENT: Neck non-tender without lymphadenopathy, masses or thyromegaly Cardio: Normal A1 and S2, no S3 or S4. Rhythm is regular. No murmurs or rubs.   Pulm: Clear to auscultation bilaterally, no crackles, wheezing, or diminished breath sounds. Normal respiratory effort Abdomen: Bowel sounds normal. Abdomen soft and non-tender.  Extremities: Right knee appears swollen and erythematous, two draining sinuses (one superior to patella, one inferior), one new area of swelling appears directly over the patella, red and tender, no drainage from new swollen area.  R knee was tender to palpation and indurated.  R knee significantly swollen compared to unaffected L knee. 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   Pt had been discharged from the same hospital on 6/20 when he had been treated for cellulitis of the same knee. Pt has a history of prosthetic joint infection and has required multiple treatments in the past including removal of the knee prosthesis and placement of a nail from hip to ankle (pt presently has no knee motility). Pt did not present with signs of systemic infection and did not progress to sepsis during this hospitalization.  Pt was kept off of antibiotics until after a knee aspiration could be done on 7/2 at which time, he was started on Vancomycin.  His home warfarin during the hospitalization until after the procedure and was restarted on 7/2.  A PICC line was put in on 7/2 without complication for administration of Vanc with home health. [facility] care was arranged [facility] by Dr. Veverly Fells and pt was discharged to his daughter's home on 7/3.   Issues for Follow Up:  1. Anticoagulation monitoring, restarted pt's warfarin dose, please monitor INR 2. Vancomycin administration and monitoring, monitor kidney function weekly  Significant Procedures:  Aspiration of R knee (7/2) PICC line (7/2)  Significant Labs and Imaging:  Recent Labs  Lab 07/17/17 1038 07/19/17 0409 07/20/17 0323  WBC 7.6 7.0 7.2  HGB 10.0* 10.2* 9.8*  HCT 34.0* 33.4* 31.7*  PLT 686* 540* 497*   Recent Labs  Lab 07/17/17 1038 07/18/17 0404 07/20/17 0323  NA 140 138 139  K 3.4* 3.9 3.7  CL 103 103 106  CO2 25 28 25   GLUCOSE 109* 113* 105*  BUN 18 14 12   CREATININE 1.01 0.99 0.93  CALCIUM 9.0 8.7* 8.7*  ALKPHOS 71 63  --   AST 16 15  --   ALT  14 13  --   ALBUMIN 3.3* 2.9*  --     Results for orders placed or performed during the hospital encounter of 07/17/17  Aerobic Culture (superficial specimen)     Status: None   Collection Time: 07/17/17  1:09 PM  Result Value Ref Range Status   Specimen Description KNEE  Final   Special Requests NONE  Final   Gram Stain   Final    NO  WBC SEEN NO ORGANISMS SEEN Performed at [facility], [address] [place], [place] [address]    Culture FEW ENTEROBACTER CLOACAE  Final   Report Status 07/19/2017 FINAL  Final   Organism ID, Bacteria ENTEROBACTER CLOACAE  Final      Susceptibility   Enterobacter cloacae - MIC*    CEFAZOLIN >=64 RESISTANT Resistant     CEFEPIME <=1 SENSITIVE Sensitive     CEFTAZIDIME <=1 SENSITIVE Sensitive     CEFTRIAXONE <=1 SENSITIVE Sensitive     CIPROFLOXACIN <=0.25 SENSITIVE Sensitive     GENTAMICIN <=1 SENSITIVE Sensitive     IMIPENEM 0.5 SENSITIVE Sensitive     TRIMETH/SULFA <=20 SENSITIVE Sensitive     PIP/TAZO <=4 SENSITIVE Sensitive     * FEW ENTEROBACTER CLOACAE  Blood culture (routine x 2)     Status: None (Preliminary result)   Collection Time: 07/17/17  3:12 PM  Result Value Ref Range Status   Specimen Description BLOOD RIGHT ANTECUBITAL  Final   Special Requests   Final    BOTTLES DRAWN AEROBIC ONLY Blood Culture results may not be optimal due to an inadequate volume of blood received in culture bottles   Culture   Final    NO GROWTH 3 DAYS Performed at [facility], [place] [address] [place], [place] [address]    Report Status PENDING  Incomplete  Blood culture (routine x 2)     Status: None (Preliminary result)   Collection Time: 07/17/17  3:12 PM  Result Value Ref Range Status   Specimen Description BLOOD RIGHT ANTECUBITAL  Final   Special Requests   Final    BOTTLES DRAWN AEROBIC AND ANAEROBIC Blood Culture adequate volume   Culture   Final    NO GROWTH 3 DAYS Performed at [facility], [place] [address], [place], [place] [address]    Report Status PENDING  Incomplete  Surgical pcr screen     Status: None   Collection Time: 07/17/17 11:43 PM  Result Value Ref Range Status   MRSA, PCR NEGATIVE NEGATIVE Final   Staphylococcus aureus NEGATIVE NEGATIVE Final    Comment: (NOTE) The Xpert SA Assay (FDA approved for NASAL specimens in patients 44 years of  age and older), is one component of a comprehensive surveillance program. It is not intended to diagnose infection nor to guide or monitor treatment. Performed at [facility], [place] [address], [place], [place] [address]   Aerobic/Anaerobic Culture (surgical/deep wound)     Status: None (Preliminary result)   Collection Time: 07/19/17  1:42 PM  Result Value Ref Range Status   Specimen Description WOUND  Final   Special Requests NONE  Final   Gram Stain   Final    ABUNDANT WBC PRESENT, PREDOMINANTLY PMN NO ORGANISMS SEEN Gram Stain Report Called to,Read Back By and Verified With: DR J CAMPBELL [id number] 07/19/17 A BROWNING    Culture   Final    CULTURE REINCUBATED FOR BETTER GROWTH Performed at [facility], [place]  [address] [place], [place] [address]    Report Status PENDING  Incomplete     Results/Tests Pending at Time of Discharge:  Aerobe/anaerobe culture of knee aspirate (taken on 7/2) Two Blood Cultures (taken on 6/30) - no growth to date (7/3)  Discharge Medications:  Allergies as of 07/20/2017      Reactions   Levofloxacin Anaphylaxis   Per Care Everywhere   Amoxicillin Swelling   Has patient had a PCN reaction causing immediate rash, facial/tongue/throat swelling, SOB or lightheadedness with hypotension: Yes Has patient had a PCN reaction causing severe rash involving mucus membranes or skin necrosis: No Has patient had a PCN reaction that required hospitalization: No Has patient had a PCN reaction occurring within the last 10 years: No' If all of the above answers are "NO", then may proceed with Cephalosporin use.      Medication List    STOP taking these medications   cephALEXin 500 MG capsule Commonly known as:  KEFLEX   doxycycline 100 MG tablet Commonly known as:  VIBRA-TABS   enoxaparin 40 MG/0.4ML injection Commonly known as:  LOVENOX   warfarin 5 MG tablet Commonly known as:  COUMADIN     TAKE these medications   acetaminophen 500 MG  tablet Commonly known as:  TYLENOL Take 500 mg by mouth every 6 (six) hours as needed.   busPIRone 7.5 MG tablet Commonly known as:  BUSPAR Take 15 mg by mouth 2 (two) times daily.   cyclobenzaprine 5 MG tablet Commonly known as:  FLEXERIL Take 5 mg by mouth 3 (three) times daily as needed for muscle spasms.   EPIPEN 2-PAK 0.3 mg/0.3 mL Soaj injection Generic drug:  EPINEPHrine Inject 1 pen into the muscle daily as needed for anaphylaxis.   gabapentin 300 MG capsule Commonly known as:  NEURONTIN Take 300 mg by mouth 3 (three) times daily.   oxyCODONE 5 MG immediate release tablet Commonly known as:  Oxy IR/ROXICODONE Take 5-10 mg by mouth every 4 (four) hours as needed for pain.   pantoprazole 40 MG tablet Commonly known as:  PROTONIX Take 40 mg by mouth daily.   vancomycin IVPB Inject 1,500 mg into the vein every 12 (twelve) hours. Indication: prosthetic joint infection (knee) Last Day of Therapy:  08/30/17 Labs - Sunday/Monday:  CBC/D, BMP, and vancomycin trough. Labs - Thursday:  BMP and vancomycin trough Labs - Every other week:  ESR and CRP            Home Infusion Instuctions  (From admission, onward)        Start     Ordered   07/19/17 0000  Home infusion instructions [facility] May follow [place] Dosing Protocol; May administer Cathflo as needed to maintain patency of vascular access device.; Flushing of vascular access device: per [facility] Protocol: 0.9% NaCl pre/post medica...    Question Answer Comment  Instructions May follow [place] Dosing Protocol   Instructions May administer Cathflo as needed to maintain patency of vascular access device.   Instructions Flushing of vascular access device: per [facility] Protocol: 0.9% NaCl pre/post medication administration and prn patency; Heparin 100 u/ml, 50m for implanted ports and Heparin 10u/ml, 539mfor all other central venous catheters.   Instructions May follow [facility] Anaphylaxis Protocol for First Dose  Administration in the home: 0.9% NaCl at 25-50 ml/hr to maintain IV access for protocol meds. Epinephrine 0.3 ml IV/IM PRN and Benadryl 25-50 IV/IM PRN s/s of anaphylaxis.   Instructions [facility] Infusion  Coordinator (RN) to assist per patient IV care needs in the home PRN.      07/19/17 2057      Discharge Instructions: Please refer to Patient Instructions section of EMR for full details.  Patient was counseled important signs and symptoms that should prompt return to medical care, changes in medications, dietary instructions, activity restrictions, and follow up appointments.   Follow-Up Appointments: Follow-up Information    [facility], [facility]-Home Follow up.   Specialty:  [place] Why:  A representative from [facility]  will contact you concerning delivery of IV antibiotics. Contact information: [address] [place] [address] [phone]        [facility] Follow [facility].   Specialty:  [place] Why:  A representative from [facility] will contact you to arrange start time for Home Health RN for IV antibiotic infusion and PICC line care Contact information: [address] [place] [address] [phone]           Matilde Haymaker, MD 07/21/2017, 1:23 PM PGY-1, [place]

## 2017-07-20 NOTE — Progress Notes (Signed)
Per Patient: Dr Ranell PatrickNorris texted the patient and was told to have all CT and radiology studies copied so that he may take those with him [facility].  Family medicine was notified regarding needing a discharge order for this patient

## 2017-07-20 NOTE — Progress Notes (Signed)
ANTICOAGULATION CONSULT NOTE - Follo up  Pharmacy Consult for Coumadin Indication: h/o VTE  Allergies  Allergen Reactions  . Levofloxacin Anaphylaxis    Per Care Everywhere  . Amoxicillin Swelling    Has patient had a PCN reaction causing immediate rash, facial/tongue/throat swelling, SOB or lightheadedness with hypotension: Yes Has patient had a PCN reaction causing severe rash involving mucus membranes or skin necrosis: No Has patient had a PCN reaction that required hospitalization: No Has patient had a PCN reaction occurring within the last 10 years: No' If all of the above answers are "NO", then may proceed with Cephalosporin use.     Patient Measurements: Height: 5\' 6"  (167.6 cm) Weight: 276 lb (125.2 kg) IBW/kg (Calculated) : 63.8  Vital Signs: Temp: 98.2 F (36.8 C) (07/03 0509) Temp Source: Oral (07/03 0509) BP: 149/60 (07/03 0509) Pulse Rate: 79 (07/03 0509)  Labs: Recent Labs    07/18/17 0404 07/19/17 0409 07/20/17 0323  HGB  --  10.2* 9.8*  HCT  --  33.4* 31.7*  PLT  --  540* 497*  LABPROT  --  18.0* 16.0*  INR  --  1.50 1.29  CREATININE 0.99  --  0.93    Estimated Creatinine Clearance: 112.2 mL/min (by C-G formula based on SCr of 0.93 mg/dL).   Medical History: Past Medical History:  Diagnosis Date  . Anxiety   . Arthritis    "knees" (07/19/2017)  . Cellulitis of right leg 06/2017  . Chronic lower back pain    "L5-S1; I've had injections"  . DVT (deep venous thrombosis) (HCC) ~ 04/2017   RUE "when PICC was removed"  . Family history of adverse reaction to anesthesia    "mother has hard time coming out of it; gets PONV" (07/19/2017)  . GERD (gastroesophageal reflux disease)   . History of blood transfusion    "I believe I have had one; related to one of the knee ORs" (07/19/2017)    Medications:  Medications Prior to Admission  Medication Sig Dispense Refill Last Dose  . acetaminophen (TYLENOL) 500 MG tablet Take 500 mg by mouth every 6 (six)  hours as needed.   Past Week at Unknown time  . busPIRone (BUSPAR) 7.5 MG tablet Take 15 mg by mouth 2 (two) times daily.  1 07/05/2017 at Unknown time  . cephALEXin (KEFLEX) 500 MG capsule Take 1 capsule (500 mg total) by mouth 2 (two) times daily. 28 capsule 0   . cyclobenzaprine (FLEXERIL) 5 MG tablet Take 5 mg by mouth 3 (three) times daily as needed for muscle spasms.  0 07/05/2017 at Unknown time  . doxycycline (VIBRA-TABS) 100 MG tablet Take 1 tablet (100 mg total) by mouth 2 (two) times daily. 28 tablet 0   . EPINEPHrine (EPIPEN 2-PAK) 0.3 mg/0.3 mL IJ SOAJ injection Inject 1 pen into the muscle daily as needed for anaphylaxis.   prn at unk  . gabapentin (NEURONTIN) 300 MG capsule Take 300 mg by mouth 3 (three) times daily.   07/05/2017 at Unknown time  . oxyCODONE (OXY IR/ROXICODONE) 5 MG immediate release tablet Take 5-10 mg by mouth every 4 (four) hours as needed for pain.  0 prn at unk  . pantoprazole (PROTONIX) 40 MG tablet Take 40 mg by mouth daily.  3 07/05/2017 at Unknown time  . warfarin (COUMADIN) 5 MG tablet Take 10-15 mg by mouth See admin instructions. Daily based on INR reading  1 07/04/2017 at 1900  . enoxaparin (LOVENOX) 40 MG/0.4ML injection Inject 0.4  mLs into the skin See admin instructions. NIGHTLY FOR 10 DAYS STOP TAKING WHEN INR REACHES 2.0  0 Completed Course at Unknown time   Scheduled:  . busPIRone  15 mg Oral BID  . cyclobenzaprine  5 mg Oral TID  . docusate sodium  100 mg Oral BID  . gabapentin  300 mg Oral TID  . pantoprazole  40 mg Oral Daily  . senna  1 tablet Oral BID  . 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   S/p  right knee fluid collection aspiration today by IR, end time 14:32.  No surgery planned here. MD resumed consulted pharmacy for Coumadin on 7/2.  FMTS MD said no heparin/lmwh needed.  PTA warfarin dose:  Patient was alternating between 10 mg + 15 mg daily with therapeutic INR with expected duration through 09/03/17 per clinic notes.  INR increased prior to admit due to drug interaction with antibiotic. I received report that his Coumadin dose was held x1 day and INR decreased to subtherapeutic 1.84 on 6/30. Due to procedures, Coumadin has been held x2 days. Warfarin was resumed 7/2 PM at higher dose.   Today INR is 1.29 (decreased due to holding).  Pltc >normal, Hgb 9.8 stable. No bleeding noted. No significant interactions noted.  Goal of Therapy:  INR 2-3   Plan:  Warfarin 15mg  x1  today.  Hopefully can go back to home dose tomorrow.  Daily Protime/INR  Thank you for allowing pharmacy to be part of this patients care team.  Link SnufferJessica Enis Riecke, PharmD, BCPS, BCCCP Clinical Pharmacist Clinical phone 07/20/2017 until 3:30PM - [phone] After hours, please call #[phone] 07/20/2017,10:56 AM

## 2017-07-20 NOTE — Care Management Note (Signed)
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                      Action/Plan: Patient received PICC line, will go home on IV antibiotics. Case manager spoke with patient concerning choice for Home Health Agency, says he will use [facility], he used them recently. Case manager called referral to [name] NeedleMichael, Lia[facility]. IV antibiotics have been arranged through [facility]. [facility] concerning IV Vanc. He will call Tammy SoursGreg with Infectious disease concerning outcome of the appointment. [facility] will be following, Case manager will follow as well concerning need [facility].   Expected Discharge Date:  07/20/17               Expected Discharge Plan:   Home with Home Health RN  In-House Referral:     Discharge planning Services  CM Consult  Post Acute Care Choice:  Home Health Choice offered to:  Patient  DME Arranged:  N/A DME Agency:  NA  HH Arranged:  RN, IV Antibiotics HH Agency:  [facility]  Status of Service:  Completed, signed off  If discussed at [employer] Length of Stay Meetings, dates discussed:    Additional Comments:  [name], [name], RN 07/20/2017, 12:44 PM

## 2017-07-20 NOTE — Care Management Important Message (Signed)
Important Message  Patient Details  Name: Randy InglesDavid A St Figueroa MRN: [id number] Date of Birth: 1960/11/22   Medicare Important Message Given:  Yes    Diella Gillingham 07/20/2017, 3:01 PM

## 2017-07-20 NOTE — Progress Notes (Signed)
[facility]  Date of Admission:  07/17/2017     Total days of antibiotics 1         ASSESSMENT/PLAN  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 He did grow enterobacter through skin wound, however surface culture does not generally provide insight into deeper infection. He previously grew Staph Lugdunensis and Press photographer.  IR aspirated knee with gram stain with no organisms and cultures remain pending. PICC line inserted yesterday and now receiving vancomycin which he tolerating with no adverse side effect or nephrotoxicity. He has remained afebrile with no signs of systemic infection. He has obtained an appointment with [facility] this afternoon and working on being discharged. Home Health has been established and I discussed with Pam who will coordinate antibiotics/teaching for home. Advised to contact me at the conclusion of appointment whether antibiotics will be continued or not.   1. Continue current dosage of vancomycin.  2. Ok for discharge from ID perspective.    Principal Problem:   Infection of prosthetic right knee joint (HCC)   . busPIRone  15 mg Oral BID  . cyclobenzaprine  5 mg Oral TID  . docusate sodium  100 mg Oral BID  . gabapentin  300 mg Oral TID  . pantoprazole  40 mg Oral Daily  . senna  1 tablet Oral BID  . Warfarin - Pharmacist Dosing Inpatient   Does not apply q1800    SUBJECTIVE:  Afebrile overnight. PICC line inserted. Cultures remain pending. Small blister on patella noted today.   Allergies  Allergen Reactions  . Levofloxacin Anaphylaxis    Per Care Everywhere  . Amoxicillin Swelling    Has patient had a PCN reaction causing immediate rash, facial/tongue/throat swelling, SOB or lightheadedness with hypotension: Yes Has patient had a PCN reaction causing severe rash  involving mucus membranes or skin necrosis: No Has patient had a PCN reaction that required hospitalization: No Has patient had a PCN reaction occurring within the last 10 years: No' If all of the above answers are "NO", then may proceed with Cephalosporin use.      Review of Systems: Review of Systems  Constitutional: Negative for chills, fever and malaise/fatigue.  Respiratory: Negative for cough, shortness of breath and wheezing.   Cardiovascular: Negative for chest pain and leg swelling.  Gastrointestinal: Negative for abdominal pain, constipation, diarrhea, nausea and vomiting.  Skin: Negative for rash.      OBJECTIVE: Vitals:   07/19/17 1353 07/19/17 1611 07/19/17 1954 07/20/17 0509  BP: 136/81 (!) 142/89 (!) 144/76 (!) 149/60  Pulse: 91 (!) 101 89 79  Resp: 16 18 16 16   Temp: 98.4 F (36.9 C) 98.7 F (37.1 C) 98.3 F (36.8 C) 98.2 F (36.8 C)  TempSrc: Oral Oral Oral Oral  SpO2: 93% 96% 97% 94%  Weight:      Height:       Body mass index is 44.55 kg/m.  Physical Exam  Constitutional: He is oriented to person, place, and time. He appears well-developed and well-nourished. No distress.  Cardiovascular: Normal rate, regular rhythm, normal heart sounds and intact distal pulses.  Pulmonary/Chest: Effort normal and breath sounds normal.  Musculoskeletal:  Dressings remain on proximal and distal incision appear clean, dry and intact. There is a new blister like area that has appeared on the anterior aspect of the patella. No drainage noted.  Neurological: He is alert and oriented to person, place, and time.  Skin: Skin is warm and dry.  Psychiatric: He has a normal mood and affect. His behavior is normal. Judgment and thought content normal.    Lab Results Lab Results  Component Value Date   WBC 7.2 07/20/2017   HGB 9.8 (L) 07/20/2017   HCT 31.7 (L) 07/20/2017   MCV 82.6 07/20/2017   PLT 497 (H) 07/20/2017    Lab Results  Component Value Date   CREATININE  0.93 07/20/2017   BUN 12 07/20/2017   NA 139 07/20/2017   K 3.7 07/20/2017   CL 106 07/20/2017   CO2 25 07/20/2017    Lab Results  Component Value Date   ALT 13 07/18/2017   AST 15 07/18/2017   ALKPHOS 63 07/18/2017   BILITOT 0.5 07/18/2017     Microbiology: Recent Results (from the past 240 hour(s))  Aerobic Culture (superficial specimen)     Status: None   Collection Time: 07/17/17  1:09 PM  Result Value Ref Range Status   Specimen Description KNEE  Final   Special Requests NONE  Final   Gram Stain   Final    NO WBC SEEN NO ORGANISMS SEEN Performed at [facility], [address] [place], [place] [address]    Culture FEW ENTEROBACTER CLOACAE  Final   Report Status 07/19/2017 FINAL  Final   Organism ID, Bacteria ENTEROBACTER CLOACAE  Final      Susceptibility   Enterobacter cloacae - MIC*    CEFAZOLIN >=64 RESISTANT Resistant     CEFEPIME <=1 SENSITIVE Sensitive     CEFTAZIDIME <=1 SENSITIVE Sensitive     CEFTRIAXONE <=1 SENSITIVE Sensitive     CIPROFLOXACIN <=0.25 SENSITIVE Sensitive     GENTAMICIN <=1 SENSITIVE Sensitive     IMIPENEM 0.5 SENSITIVE Sensitive     TRIMETH/SULFA <=20 SENSITIVE Sensitive     PIP/TAZO <=4 SENSITIVE Sensitive     * FEW ENTEROBACTER CLOACAE  Blood culture (routine x 2)     Status: None (Preliminary result)   Collection Time: 07/17/17  3:12 PM  Result Value Ref Range Status   Specimen Description BLOOD RIGHT ANTECUBITAL  Final   Special Requests   Final    BOTTLES DRAWN AEROBIC ONLY Blood Culture results may not be optimal due to an inadequate volume of blood received in culture bottles   Culture   Final    NO GROWTH 2 DAYS Performed at [facility], [address], [place], [place] [address]    Report Status PENDING  Incomplete  Blood culture (routine x 2)     Status: None (Preliminary result)   Collection Time: 07/17/17  3:12 PM  Result Value Ref Range Status   Specimen Description BLOOD RIGHT ANTECUBITAL  Final    Special Requests   Final    BOTTLES DRAWN AEROBIC AND ANAEROBIC Blood Culture adequate volume   Culture   Final    NO GROWTH 2 DAYS Performed at [facility], [address], [place], [place] [address]    Report Status PENDING  Incomplete  Surgical pcr screen     Status: None   Collection Time: 07/17/17 11:43 PM  Result Value Ref Range Status   MRSA, PCR NEGATIVE NEGATIVE Final   Staphylococcus aureus NEGATIVE NEGATIVE Final    Comment: (NOTE) The Xpert SA Assay (FDA approved for NASAL specimens in patients 21 years of age and older), is one component of a comprehensive  surveillance program. It is not intended to diagnose infection nor to guide or monitor treatment. Performed at [facility], [address] [place], [place] [address]   Aerobic/Anaerobic Culture (surgical/deep wound)     Status: None (Preliminary result)   Collection Time: 07/19/17  1:42 PM  Result Value Ref Range Status   Specimen Description WOUND  Final   Special Requests NONE  Final   Gram Stain   Final    ABUNDANT WBC PRESENT, PREDOMINANTLY PMN NO ORGANISMS SEEN Gram Stain Report Called to,Read Back By and Verified With: DR J CAMPBELL [id number] 07/19/17 A BROWNING Performed at [facility], [address] [place], [place] [address]    Culture PENDING  Incomplete   Report Status PENDING  Incomplete     Marcos EkeGreg Ayshia Gramlich, NP [facility] [phone] Pager  07/20/2017  9:41 AM

## 2017-07-20 NOTE — Progress Notes (Signed)
[facility]  [facility] Coordinator following pt with ID team to support home IV ABX if needed at DC and [facility].  E[place] address is [place] [address].  If patient discharges after hours, please call [phone].   Randy Figueroa 07/20/2017, 8:41 AM

## 2017-07-20 NOTE — Progress Notes (Signed)
Written and verbal discharge instructions provided.  [facility] a [facility] 3[facility].  PICC line in place in the left upper arm.  The site is without redness or swelling.  Patient is awaiting to complete IV Vancomycin and a copy of his disc from [facility].

## 2017-07-20 NOTE — Progress Notes (Signed)
Orthopedics Progress Note  Subjective: No change in knee symptoms  Objective:  Vitals:   07/19/17 1954 07/20/17 0509  BP: (!) 144/76 (!) 149/60  Pulse: 89 79  Resp: 16 16  Temp: 98.3 F (36.8 C) 98.2 F (36.8 C)  SpO2: 97% 94%    General: Awake and alert  Musculoskeletal: Right leg erythema slightly improved. Swelling stable. No pain with calf pumps Still active drainage from two sites Neurovascularly intact  Lab Results  Component Value Date   WBC 7.2 07/20/2017   HGB 9.8 (L) 07/20/2017   HCT 31.7 (L) 07/20/2017   MCV 82.6 07/20/2017   PLT 497 (H) 07/20/2017       Component Value Date/Time   NA 139 07/20/2017 0323   K 3.7 07/20/2017 0323   CL 106 07/20/2017 0323   CO2 25 07/20/2017 0323   GLUCOSE 105 (H) 07/20/2017 0323   BUN 12 07/20/2017 0323   CREATININE 0.93 07/20/2017 0323   CALCIUM 8.7 (L) 07/20/2017 0323   GFRNONAA >60 07/20/2017 0323   GFRAA >60 07/20/2017 0323    Lab Results  Component Value Date   INR 1.29 07/20/2017   INR 1.50 07/19/2017   INR 1.84 07/17/2017    Assessment/Plan: Right chronic knee infection No growth from knee aspiration and no organisms per gram stain Patient had PICC line placed for IV antibiotics per ID We will place referral in for Dr Josefine ClassScott Wilson [facility] for eval of chronic knee infection s/p fusion.  This will be done as an outpatient.  Dr Andrey CampanileWilson has agreed to see the patient. Suppressive abx until then  [employer]Steven R. Ranell PatrickNorris, MD 07/20/2017 10:02 AM

## 2017-07-22 LAB — CULTURE, BLOOD (ROUTINE X 2)
CULTURE: NO GROWTH
Culture: NO GROWTH
SPECIAL REQUESTS: ADEQUATE

## 2017-07-24 LAB — AEROBIC/ANAEROBIC CULTURE W GRAM STAIN (SURGICAL/DEEP WOUND)

## 2017-07-24 LAB — AEROBIC/ANAEROBIC CULTURE (SURGICAL/DEEP WOUND)

## 2017-07-27 ENCOUNTER — Telehealth: Payer: Self-pay

## 2017-07-27 NOTE — Telephone Encounter (Signed)
Patient states he was referred to Dr Josefine ClassScott Wilson [facility] who will be following patient for infectious disease process.    Laurell Josephsammy K Monia Timmers, RN

## 2017-07-27 NOTE — Telephone Encounter (Signed)
Patient left message on machine requesting refill of IV antibiotics.   Previous call from patient stating he was changing his Infectious Disease care.  I will contact [facility] to inform them of the change.  If his care has been changed we will no longer provide IV Atbx orders./care.

## 2017-07-27 NOTE — Telephone Encounter (Signed)
Noted. Thanks.

## 2017-07-27 NOTE — Telephone Encounter (Signed)
[facility] notified by [facility] that patient did not have labs draws yesterday for vancomycin dosing, and that he had already started infusing vancomycin when the nurse went out today to draw them. Labs will be drawn tomorrow 7/11. RN relayed per chart that [facility] has [facility], [facility]. Andree CossHowell, Bueford Arp M, RN

## 2017-07-29 ENCOUNTER — Other Ambulatory Visit: Payer: Self-pay | Admitting: Pharmacist

## 2017-08-01 ENCOUNTER — Encounter: Payer: Self-pay | Admitting: Physician Assistant

## 2017-08-01 ENCOUNTER — Ambulatory Visit (INDEPENDENT_AMBULATORY_CARE_PROVIDER_SITE_OTHER): Payer: Medicare Other | Admitting: Physician Assistant

## 2017-08-01 ENCOUNTER — Other Ambulatory Visit: Payer: Self-pay

## 2017-08-01 VITALS — BP 110/70 | HR 86 | Temp 98.5°F | Resp 16 | Ht 65.0 in | Wt 270.0 lb

## 2017-08-01 DIAGNOSIS — I82621 Acute embolism and thrombosis of deep veins of right upper extremity: Secondary | ICD-10-CM

## 2017-08-01 DIAGNOSIS — F419 Anxiety disorder, unspecified: Secondary | ICD-10-CM | POA: Diagnosis not present

## 2017-08-01 DIAGNOSIS — Z96659 Presence of unspecified artificial knee joint: Secondary | ICD-10-CM | POA: Diagnosis not present

## 2017-08-01 DIAGNOSIS — T8459XD Infection and inflammatory reaction due to other internal joint prosthesis, subsequent encounter: Secondary | ICD-10-CM | POA: Diagnosis not present

## 2017-08-01 MED ORDER — BUSPIRONE HCL 7.5 MG PO TABS: 15.0000 mg | ORAL_TABLET | Freq: Two times a day (BID) | ORAL | 5 refills | Status: AC

## 2017-08-01 MED ORDER — CYCLOBENZAPRINE HCL 5 MG PO TABS: 5.0000 mg | ORAL_TABLET | Freq: Three times a day (TID) | ORAL | 0 refills | Status: DC | PRN

## 2017-08-01 MED ORDER — GABAPENTIN 300 MG PO CAPS: 300.0000 mg | ORAL_CAPSULE | Freq: Three times a day (TID) | ORAL | 3 refills | Status: AC

## 2017-08-01 NOTE — Patient Instructions (Signed)
I am going to send a message to [facility] to alter the frequency of your INR checks. I am also going to have them start wound care.  Follow-up specialists as scheduled. I have refilled the needed medications. I will call you once I have read through your records so we can decide on a stop date for your coumadin.  Please schedule an appointment for a check up with fasting labs in the next 4-6 weeks.  It was very nice meeting you today.

## 2017-08-01 NOTE — Progress Notes (Signed)
Patient presents to clinic today to establish care.  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 Most recently being followed by [facility]. Is currently under the care of Mauricio Po, NP. Is in the proceed of transferring to ID at [facility] in early August. Patient is currently receiving in-home Vancomycin infusions to treat infection. Denies fever, chills, N/V.  During one of his recent hospitalizations he developed a DVT after removal of PICC line. As such he was placed on anticoagulation to continue 3 months of therapy. Is taking Coumadin 10 mg daily. INR is being checked 2 x week currently by home health. Results are being sent to his prior PCP up [place] currently.   Chronic Issues: Anxiety -- long-standing. Patient currently on a regimen of Buspirone 15 mg BID which he notes he has been taking for some time. Has done well on this regimen. Denies breakthrough symptoms.   Health Maintenance: Immunizations -- Tetanus up-to-date. Patient had flu shot last fall.   Past Medical History:  Diagnosis Date  . Anxiety   . Arthritis    "knees" (07/19/2017)  . Cellulitis of right leg 06/2017  . Chronic lower back pain    "L5-S1; I've had injections"  . DVT (deep venous thrombosis) ([place]) ~ 04/2017   RUE "when PICC was removed"  . Family history of adverse reaction to anesthesia    "mother has hard time coming out of it; gets PONV" (07/19/2017)  . GERD (gastroesophageal reflux disease)   . History of blood transfusion    "I believe I have had one; related to one of the knee ORs" (07/19/2017)    Past Surgical History:  Procedure Laterality Date  . APPENDECTOMY  ~ 1976  . EXCISIONAL TOTAL KNEE ARTHROPLASTY WITH ANTIBIOTIC SPACERS Right   . EXCISIONAL TOTAL KNEE ARTHROPLASTY WITH ANTIBIOTIC SPACERS Right   . EXCISIONAL TOTAL KNEE ARTHROPLASTY WITH ANTIBIOTIC SPACERS Right 01/2017  . I&D KNEE WITH POLY EXCHANGE Right   .  JOINT REPLACEMENT    . KNEE FUSION Right 04/2017   "nail from hip to ankle"  . KNEE JOINT MANIPULATION Right   . TOTAL KNEE ARTHROPLASTY Bilateral 2014  . TOTAL KNEE ARTHROPLASTY Right   . TOTAL KNEE ARTHROPLASTY Right   . TUMOR EXCISION Right 1980s   "benign; back of my leg"    Current Outpatient Medications on File Prior to Visit  Medication Sig Dispense Refill  . acetaminophen (TYLENOL) 500 MG tablet Take 500 mg by mouth every 6 (six) hours as needed.    . cefTRIAXone (ROCEPHIN) 2 g injection Administer 2 gram IV push over 10 min every 24 hrs as directed    . EPINEPHrine (EPIPEN 2-PAK) 0.3 mg/0.3 mL IJ SOAJ injection Inject 1 pen into the muscle daily as needed for anaphylaxis.    Marland Kitchen oxyCODONE (OXY IR/ROXICODONE) 5 MG immediate release tablet Take 5-10 mg by mouth every 4 (four) hours as needed for pain.  0  . pantoprazole (PROTONIX) 40 MG tablet Take 40 mg by mouth daily.  3  . traMADol (ULTRAM) 50 MG tablet Take 1 tablet by mouth 2 (two) times daily.    . vancomycin IVPB Inject 1,500 mg into the vein every 12 (twelve) hours. Indication: prosthetic joint infection (knee) Last Day of Therapy:  08/30/17 Labs - Sunday/Monday:  CBC/D, BMP, and vancomycin trough. Labs - Thursday:  BMP and vancomycin trough Labs - Every other week:  ESR and CRP 84 Units 0  No current facility-administered medications on file prior to visit.     Allergies  Allergen Reactions  . Levofloxacin Anaphylaxis    Per Care Everywhere  . Amoxicillin Swelling    Has patient had a PCN reaction causing immediate rash, facial/tongue/throat swelling, SOB or lightheadedness with hypotension: Yes Has patient had a PCN reaction causing severe rash involving mucus membranes or skin necrosis: No Has patient had a PCN reaction that required hospitalization: No Has patient had a PCN reaction occurring within the last 10 years: No' If all of the above answers are "NO", then may proceed with Cephalosporin use.      History reviewed. No pertinent family history.  Social History   Socioeconomic History  . Marital status: Married    Spouse name: Not on file  . Number of children: Not on file  . Years of education: Not on file  . Highest education level: Not on file  Occupational History  . Not on file  Social Needs  . Financial resource strain: Not on file  . Food insecurity:    Worry: Not on file    Inability: Not on file  . Transportation needs:    Medical: Not on file    Non-medical: Not on file  Tobacco Use  . Smoking status: Former Smoker    Packs/day: 1.00    Years: 20.00    Pack years: 20.00    Types: Cigarettes    Last attempt to quit: 1998    Years since quitting: 21.5  . Smokeless tobacco: Never Used  Substance and Sexual Activity  . Alcohol use: Not Currently  . Drug use: Never  . Sexual activity: Not Currently  Lifestyle  . Physical activity:    Days per week: Not on file    Minutes per session: Not on file  . Stress: Not on file  Relationships  . Social connections:    Talks on phone: Not on file    Gets together: Not on file    Attends religious service: Not on file    Active member of club or organization: Not on file    Attends meetings of clubs or organizations: Not on file    Relationship status: Not on file  . Intimate partner violence:    Fear of current or ex partner: Not on file    Emotionally abused: Not on file    Physically abused: Not on file    Forced sexual activity: Not on file  Other Topics Concern  . Not on file  Social History Narrative  . Not on file   Review of Systems  Constitutional: Negative for chills, fever and malaise/fatigue.  Eyes: Negative for blurred vision and double vision.  Cardiovascular: Negative for chest pain and palpitations.  Musculoskeletal: Positive for joint pain and myalgias.  Neurological: Negative for dizziness and loss of consciousness.  Psychiatric/Behavioral: Negative for depression, hallucinations,  substance abuse and suicidal ideas. The patient is nervous/anxious. The patient does not have insomnia.    BP 110/70   Pulse 86   Temp 98.5 F (36.9 C) (Oral)   Resp 16   Ht 5' 5"  (1.651 m)   Wt 270 lb (122.5 kg)   SpO2 98%   BMI 44.93 kg/m   Physical Exam  Constitutional: He is oriented to person, place, and time. He appears well-developed and well-nourished.  HENT:  Head: Normocephalic and atraumatic.  Eyes: Conjunctivae are normal.  Neck: Neck supple.  Cardiovascular: Normal rate, regular rhythm and normal heart sounds.  Pulmonary/Chest: Effort normal and breath sounds normal. No stridor. No respiratory distress. He has no wheezes. He has no rales. He exhibits no tenderness.  Neurological: He is alert and oriented to person, place, and time.  Vitals reviewed.   Recent Results (from the past 2160 hour(s))  Comprehensive metabolic panel     Status: Abnormal   Collection Time: 07/05/17 12:47 PM  Result Value Ref Range   Sodium 140 135 - 145 mmol/L   Potassium 3.4 (L) 3.5 - 5.1 mmol/L   Chloride 102 101 - 111 mmol/L   CO2 27 22 - 32 mmol/L   Glucose, Bld 110 (H) 65 - 99 mg/dL   BUN 15 6 - 20 mg/dL   Creatinine, Ser 1.12 0.61 - 1.24 mg/dL   Calcium 9.2 8.9 - 10.3 mg/dL   Total Protein 7.7 6.5 - 8.1 g/dL   Albumin 3.5 3.5 - 5.0 g/dL   AST 14 (L) 15 - 41 U/L   ALT 14 (L) 17 - 63 U/L   Alkaline Phosphatase 92 38 - 126 U/L   Total Bilirubin 0.4 0.3 - 1.2 mg/dL   GFR calc non Af Amer >60 >60 mL/min   GFR calc Af Amer >60 >60 mL/min    Comment: (NOTE) The eGFR has been calculated using the CKD EPI equation. This calculation has not been validated in all clinical situations. eGFR's persistently <60 mL/min signify possible Chronic Kidney Disease.    Anion gap 11 5 - 15    Comment: Performed at [facility] [address], [place], [place] [address]  CBC with Differential     Status: Abnormal   Collection Time: 07/05/17 12:47 PM  Result Value Ref Range   WBC 10.3  4.0 - 10.5 K/uL   RBC 3.94 (L) 4.22 - 5.81 MIL/uL   Hemoglobin 10.4 (L) 13.0 - 17.0 g/dL   HCT 33.6 (L) 39.0 - 52.0 %   MCV 85.3 78.0 - 100.0 fL   MCH 26.4 26.0 - 34.0 pg   MCHC 31.0 30.0 - 36.0 g/dL   RDW 15.2 11.5 - 15.5 %   Platelets 318 150 - 400 K/uL   Neutrophils Relative % 67 %   Neutro Abs 6.9 1.7 - 7.7 K/uL   Lymphocytes Relative 15 %   Lymphs Abs 1.6 0.7 - 4.0 K/uL   Monocytes Relative 15 %   Monocytes Absolute 1.5 (H) 0.1 - 1.0 K/uL   Eosinophils Relative 3 %   Eosinophils Absolute 0.3 0.0 - 0.7 K/uL   Basophils Relative 1 %   Basophils Absolute 0.1 0.0 - 0.1 K/uL   Immature Granulocytes 1 %   Abs Immature Granulocytes 0.1 0.0 - 0.1 K/uL    Comment: Performed at [facility] [address] [place], [place] [address]  Protime-INR     Status: Abnormal   Collection Time: 07/05/17 12:47 PM  Result Value Ref Range   Prothrombin Time 26.5 (H) 11.4 - 15.2 seconds   INR 2.46     Comment: Performed at [facility] [address], [place], [place] [address]  I-Stat CG4 Lactic Acid, ED     Status: None   Collection Time: 07/05/17 12:55 PM  Result Value Ref Range   Lactic Acid, Venous 1.50 0.5 - 1.9 mmol/L  Urinalysis, Routine w reflex microscopic     Status: Abnormal   Collection Time: 07/05/17  3:10 PM  Result Value Ref Range   Color, Urine YELLOW YELLOW   APPearance CLEAR CLEAR  Specific Gravity, Urine 1.028 1.005 - 1.030   pH 5.0 5.0 - 8.0   Glucose, UA NEGATIVE NEGATIVE mg/dL   Hgb urine dipstick MODERATE (A) NEGATIVE   Bilirubin Urine NEGATIVE NEGATIVE   Ketones, ur NEGATIVE NEGATIVE mg/dL   Protein, ur 30 (A) NEGATIVE mg/dL   Nitrite NEGATIVE NEGATIVE   Leukocytes, UA NEGATIVE NEGATIVE   RBC / HPF 11-20 0 - 5 RBC/hpf   WBC, UA 0-5 0 - 5 WBC/hpf   Bacteria, UA RARE (A) NONE SEEN   Mucus PRESENT     Comment: Performed at [facility] [address] [place], [place] [address]  Basic metabolic panel     Status: Abnormal   Collection Time:  07/06/17  5:43 AM  Result Value Ref Range   Sodium 139 135 - 145 mmol/L   Potassium 3.6 3.5 - 5.1 mmol/L   Chloride 104 101 - 111 mmol/L   CO2 28 22 - 32 mmol/L   Glucose, Bld 114 (H) 65 - 99 mg/dL   BUN 13 6 - 20 mg/dL   Creatinine, Ser 0.99 0.61 - 1.24 mg/dL   Calcium 8.7 (L) 8.9 - 10.3 mg/dL   GFR calc non Af Amer >60 >60 mL/min   GFR calc Af Amer >60 >60 mL/min    Comment: (NOTE) The eGFR has been calculated using the CKD EPI equation. This calculation has not been validated in all clinical situations. eGFR's persistently <60 mL/min signify possible Chronic Kidney Disease.    Anion gap 7 5 - 15    Comment: Performed at [facility] [address], [place], [place] [address]  CBC     Status: Abnormal   Collection Time: 07/06/17  5:43 AM  Result Value Ref Range   WBC 8.7 4.0 - 10.5 K/uL   RBC 3.69 (L) 4.22 - 5.81 MIL/uL   Hemoglobin 9.5 (L) 13.0 - 17.0 g/dL   HCT 31.2 (L) 39.0 - 52.0 %   MCV 84.6 78.0 - 100.0 fL   MCH 25.7 (L) 26.0 - 34.0 pg   MCHC 30.4 30.0 - 36.0 g/dL   RDW 15.4 11.5 - 15.5 %   Platelets 281 150 - 400 K/uL    Comment: Performed at [facility], [place] [address] [place], [place] [address]  Protime-INR     Status: Abnormal   Collection Time: 07/06/17  5:43 AM  Result Value Ref Range   Prothrombin Time 28.5 (H) 11.4 - 15.2 seconds   INR 2.70     Comment: Performed at [facility] [address], [place],  [address]  HIV antibody     Status: None   Collection Time: 07/06/17  5:43 AM  Result Value Ref Range   HIV Screen 4th Generation wRfx Non Reactive Non Reactive    Comment: (NOTE) Performed At: [facility] [place], [place] [id number] Rush Farmer MD NO:[id number] Performed at [facility], [place] [address] [place], [place] [address]   CBC     Status: Abnormal   Collection Time: 07/07/17  5:55 AM  Result Value Ref Range   WBC 9.6 4.0 - 10.5 K/uL   RBC 4.14 (L) 4.22 - 5.81 MIL/uL    Hemoglobin 10.7 (L) 13.0 - 17.0 g/dL   HCT 35.1 (L) 39.0 - 52.0 %   MCV 84.8 78.0 - 100.0 fL   MCH 25.8 (L) 26.0 - 34.0 pg   MCHC 30.5 30.0 - 36.0 g/dL   RDW  15.5 11.5 - 15.5 %   Platelets 350 150 - 400 K/uL    Comment: Performed at [facility], [place] [address], [place], [place] [address]  Protime-INR     Status: Abnormal   Collection Time: 07/07/17  5:55 AM  Result Value Ref Range   Prothrombin Time 25.5 (H) 11.4 - 15.2 seconds   INR 2.35     Comment: Performed at [facility] [address], [place], [place] [address]  Basic metabolic panel     Status: Abnormal   Collection Time: 07/07/17  5:55 AM  Result Value Ref Range   Sodium 140 135 - 145 mmol/L   Potassium 3.6 3.5 - 5.1 mmol/L   Chloride 102 101 - 111 mmol/L   CO2 26 22 - 32 mmol/L   Glucose, Bld 106 (H) 65 - 99 mg/dL   BUN 12 6 - 20 mg/dL   Creatinine, Ser 1.02 0.61 - 1.24 mg/dL   Calcium 9.1 8.9 - 10.3 mg/dL   GFR calc non Af Amer >60 >60 mL/min   GFR calc Af Amer >60 >60 mL/min    Comment: (NOTE) The eGFR has been calculated using the CKD EPI equation. This calculation has not been validated in all clinical situations. eGFR's persistently <60 mL/min signify possible Chronic Kidney Disease.    Anion gap 12 5 - 15    Comment: Performed at [facility] [address], [place], [place] [address]  Vancomycin, trough     Status: Abnormal   Collection Time: 07/07/17  5:55 AM  Result Value Ref Range   Vancomycin Tr 12 (L) 15 - 20 ug/mL    Comment: Performed at [facility], [place] [address], [place], [place] [address]  INR     Status: Abnormal   Collection Time: 07/11/17  1:40 PM  Result Value Ref Range   INR 3.9 (A) 2.0 - 3.0    Comment: 47.3 seconds  Comprehensive metabolic panel     Status: Abnormal   Collection Time: 07/17/17 10:38 AM  Result Value Ref Range   Sodium 140 135 - 145 mmol/L   Potassium 3.4 (L) 3.5 - 5.1 mmol/L   Chloride 103 98 - 111 mmol/L    Comment: Please note change  in reference range.   CO2 25 22 - 32 mmol/L   Glucose, Bld 109 (H) 70 - 99 mg/dL    Comment: Please note change in reference range.   BUN 18 6 - 20 mg/dL    Comment: Please note change in reference range.   Creatinine, Ser 1.01 0.61 - 1.24 mg/dL   Calcium 9.0 8.9 - 10.3 mg/dL   Total Protein 7.7 6.5 - 8.1 g/dL   Albumin 3.3 (L) 3.5 - 5.0 g/dL   AST 16 15 - 41 U/L   ALT 14 0 - 44 U/L    Comment: Please note change in reference range.   Alkaline Phosphatase 71 38 - 126 U/L   Total Bilirubin 0.4 0.3 - 1.2 mg/dL   GFR calc non Af Amer >60 >60 mL/min   GFR calc Af Amer >60 >60 mL/min    Comment: (NOTE) The eGFR has been calculated using the CKD EPI equation. This calculation has not been validated in all clinical situations. eGFR's persistently <60 mL/min signify possible Chronic Kidney Disease.    Anion gap 12 5 - 15    Comment: Performed at [facility] [address], [place], [place] [address]  CBC  with Differential     Status: Abnormal   Collection Time: 07/17/17 10:38 AM  Result Value Ref Range   WBC 7.6 4.0 - 10.5 K/uL   RBC 3.98 (L) 4.22 - 5.81 MIL/uL   Hemoglobin 10.0 (L) 13.0 - 17.0 g/dL   HCT 34.0 (L) 39.0 - 52.0 %   MCV 85.4 78.0 - 100.0 fL   MCH 25.1 (L) 26.0 - 34.0 pg   MCHC 29.4 (L) 30.0 - 36.0 g/dL   RDW 15.5 11.5 - 15.5 %   Platelets 686 (H) 150 - 400 K/uL   Neutrophils Relative % 64 %   Neutro Abs 4.7 1.7 - 7.7 K/uL   Lymphocytes Relative 22 %   Lymphs Abs 1.7 0.7 - 4.0 K/uL   Monocytes Relative 10 %   Monocytes Absolute 0.8 0.1 - 1.0 K/uL   Eosinophils Relative 2 %   Eosinophils Absolute 0.2 0.0 - 0.7 K/uL   Basophils Relative 1 %   Basophils Absolute 0.1 0.0 - 0.1 K/uL   Immature Granulocytes 1 %   Abs Immature Granulocytes 0.1 0.0 - 0.1 K/uL    Comment: Performed at [facility], [address], [place], [place] [address]  Protime-INR     Status: Abnormal   Collection Time: 07/17/17 10:38 AM  Result Value Ref Range   Prothrombin Time  21.1 (H) 11.4 - 15.2 seconds   INR 1.84     Comment: Performed at [facility] [address], [place], [place] [address]  I-Stat CG4 Lactic Acid, ED     Status: None   Collection Time: 07/17/17 10:52 AM  Result Value Ref Range   Lactic Acid, Venous 1.48 0.5 - 1.9 mmol/L  Aerobic Culture (superficial specimen)     Status: None   Collection Time: 07/17/17  1:09 PM  Result Value Ref Range   Specimen Description KNEE    Special Requests NONE    Gram Stain      NO WBC SEEN NO ORGANISMS SEEN Performed at [facility], [place] [address], [place], [place] [address]    Culture FEW ENTEROBACTER CLOACAE    Report Status 07/19/2017 FINAL    Organism ID, Bacteria ENTEROBACTER CLOACAE       Susceptibility   Enterobacter cloacae - MIC*    CEFAZOLIN >=64 RESISTANT Resistant     CEFEPIME <=1 SENSITIVE Sensitive     CEFTAZIDIME <=1 SENSITIVE Sensitive     CEFTRIAXONE <=1 SENSITIVE Sensitive     CIPROFLOXACIN <=0.25 SENSITIVE Sensitive     GENTAMICIN <=1 SENSITIVE Sensitive     IMIPENEM 0.5 SENSITIVE Sensitive     TRIMETH/SULFA <=20 SENSITIVE Sensitive     PIP/TAZO <=4 SENSITIVE Sensitive     * FEW ENTEROBACTER CLOACAE  Sedimentation rate     Status: Abnormal   Collection Time: 07/17/17  3:11 PM  Result Value Ref Range   Sed Rate 105 (H) 0 - 16 mm/hr    Comment: Performed at [facility] [address] [place], [place] [address]  C-reactive protein     Status: Abnormal   Collection Time: 07/17/17  3:11 PM  Result Value Ref Range   CRP 7.1 (H) <1.0 mg/dL    Comment: Performed at [facility], [place] [address] [place], [place] [address]  Blood culture (routine x 2)     Status: None   Collection Time: 07/17/17  3:12 PM  Result Value Ref Range   Specimen Description BLOOD RIGHT ANTECUBITAL  Special Requests      BOTTLES DRAWN AEROBIC ONLY Blood Culture results may not be optimal due to an inadequate volume of blood received in culture bottles   Culture      NO  GROWTH 5 DAYS Performed at [facility] [address], [place], [place] [address]    Report Status 07/22/2017 FINAL   Blood culture (routine x 2)     Status: None   Collection Time: 07/17/17  3:12 PM  Result Value Ref Range   Specimen Description BLOOD RIGHT ANTECUBITAL    Special Requests      BOTTLES DRAWN AEROBIC AND ANAEROBIC Blood Culture adequate volume   Culture      NO GROWTH 5 DAYS Performed at [facility], [place] [address], [place], [place] [address]    Report Status 07/22/2017 FINAL   Surgical pcr screen     Status: None   Collection Time: 07/17/17 11:43 PM  Result Value Ref Range   MRSA, PCR NEGATIVE NEGATIVE   Staphylococcus aureus NEGATIVE NEGATIVE    Comment: (NOTE) The Xpert SA Assay (FDA approved for NASAL specimens in patients 32 years of age and older), is one component of a comprehensive surveillance program. It is not intended to diagnose infection nor to guide or monitor treatment. Performed at [facility], [place] [address] [place], [place] [address]   Comprehensive metabolic panel     Status: Abnormal   Collection Time: 07/18/17  4:04 AM  Result Value Ref Range   Sodium 138 135 - 145 mmol/L   Potassium 3.9 3.5 - 5.1 mmol/L   Chloride 103 98 - 111 mmol/L    Comment: Please note change in reference range.   CO2 28 22 - 32 mmol/L   Glucose, Bld 113 (H) 70 - 99 mg/dL    Comment: Please note change in reference range.   BUN 14 6 - 20 mg/dL    Comment: Please note change in reference range.   Creatinine, Ser 0.99 0.61 - 1.24 mg/dL   Calcium 8.7 (L) 8.9 - 10.3 mg/dL   Total Protein 6.6 6.5 - 8.1 g/dL   Albumin 2.9 (L) 3.5 - 5.0 g/dL   AST 15 15 - 41 U/L   ALT 13 0 - 44 U/L    Comment: Please note change in reference range.   Alkaline Phosphatase 63 38 - 126 U/L   Total Bilirubin 0.5 0.3 - 1.2 mg/dL   GFR calc non Af Amer >60 >60 mL/min   GFR calc Af Amer >60 >60 mL/min    Comment: (NOTE) The eGFR has been calculated using the CKD  EPI equation. This calculation has not been validated in all clinical situations. eGFR's persistently <60 mL/min signify possible Chronic Kidney Disease.    Anion gap 7 5 - 15    Comment: Performed at [facility] [address], [place], [place] [address]  Protime-INR     Status: Abnormal   Collection Time: 07/19/17  4:09 AM  Result Value Ref Range   Prothrombin Time 18.0 (H) 11.4 - 15.2 seconds   INR 1.50     Comment: Performed at [facility] [address], [place] [address]  CBC     Status: Abnormal   Collection Time: 07/19/17  4:09 AM  Result Value Ref Range   WBC 7.0 4.0 - 10.5 K/uL   RBC 3.98 (L) 4.22 - 5.81 MIL/uL   Hemoglobin 10.2 (L) 13.0 - 17.0  g/dL   HCT 33.4 (L) 39.0 - 52.0 %   MCV 83.9 78.0 - 100.0 fL   MCH 25.6 (L) 26.0 - 34.0 pg   MCHC 30.5 30.0 - 36.0 g/dL   RDW 15.9 (H) 11.5 - 15.5 %   Platelets 540 (H) 150 - 400 K/uL    Comment: Performed at [facility] [address] [place], [place] [address]  Aerobic/Anaerobic Culture (surgical/deep wound)     Status: None   Collection Time: 07/19/17  1:42 PM  Result Value Ref Range   Specimen Description WOUND    Special Requests NONE    Gram Stain      ABUNDANT WBC PRESENT, PREDOMINANTLY PMN NO ORGANISMS SEEN Gram Stain Report Called to,Read Back By and Verified With: DR J CAMPBELL [id number] 07/19/17 A BROWNING    Culture      FEW ENTEROBACTER CLOACAE NO ANAEROBES ISOLATED Performed at [facility], [place] [address], [place], [place] [address]    Report Status 07/24/2017 FINAL    Organism ID, Bacteria ENTEROBACTER CLOACAE       Susceptibility   Enterobacter cloacae - MIC*    CEFAZOLIN >=64 RESISTANT Resistant     CEFEPIME <=1 SENSITIVE Sensitive     CEFTAZIDIME <=1 SENSITIVE Sensitive     CEFTRIAXONE <=1 SENSITIVE Sensitive     CIPROFLOXACIN <=0.25 SENSITIVE Sensitive     GENTAMICIN <=1 SENSITIVE Sensitive     IMIPENEM 0.5 SENSITIVE Sensitive     TRIMETH/SULFA <=20 SENSITIVE  Sensitive     PIP/TAZO <=4 SENSITIVE Sensitive     * FEW ENTEROBACTER CLOACAE  Protime-INR     Status: Abnormal   Collection Time: 07/20/17  3:23 AM  Result Value Ref Range   Prothrombin Time 16.0 (H) 11.4 - 15.2 seconds   INR 1.29     Comment: Performed at [facility] [address], [place], [place] [address]  CBC     Status: Abnormal   Collection Time: 07/20/17  3:23 AM  Result Value Ref Range   WBC 7.2 4.0 - 10.5 K/uL   RBC 3.84 (L) 4.22 - 5.81 MIL/uL   Hemoglobin 9.8 (L) 13.0 - 17.0 g/dL   HCT 31.7 (L) 39.0 - 52.0 %   MCV 82.6 78.0 - 100.0 fL   MCH 25.5 (L) 26.0 - 34.0 pg   MCHC 30.9 30.0 - 36.0 g/dL   RDW 15.6 (H) 11.5 - 15.5 %   Platelets 497 (H) 150 - 400 K/uL    Comment: Performed at [facility], [place] [address] [place], [place] [address]  Basic metabolic panel     Status: Abnormal   Collection Time: 07/20/17  3:23 AM  Result Value Ref Range   Sodium 139 135 - 145 mmol/L   Potassium 3.7 3.5 - 5.1 mmol/L   Chloride 106 98 - 111 mmol/L    Comment: Please note change in reference range.   CO2 25 22 - 32 mmol/L   Glucose, Bld 105 (H) 70 - 99 mg/dL    Comment: Please note change in reference range.   BUN 12 6 - 20 mg/dL    Comment: Please note change in reference range.   Creatinine, Ser 0.93 0.61 - 1.24 mg/dL   Calcium 8.7 (L) 8.9 - 10.3 mg/dL   GFR calc non Af Amer >60 >60 mL/min   GFR calc Af Amer >60 >60 mL/min    Comment: (NOTE) The eGFR has been calculated using the CKD EPI equation.  This calculation has not been validated in all clinical situations. eGFR's persistently <60 mL/min signify possible Chronic Kidney Disease.    Anion gap 8 5 - 15    Comment: Performed at [facility] [address] [place], [place] [address]    Assessment/Plan: Deep vein thrombosis (DVT) of right upper extremity ([place]) Records reviewed in EMR and Care Everywhere. 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 Needs total of 3 months of anticoagulation. Will finish three  months course by the end of August.  Will continue coumadin and take over INR checks. The last 2 have been in therapeutic range, will reduce to 1 x week check. If stable will reduce to once every 4 weeks. Follow-up 1 months.  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 Will be establishing with new ID practice at [facility] next month. Continue care discussed by specialist. Will make sure wound care is added to list of home health services.   Anxiety Stable. Medications refilled. Will monitor closely.     Leeanne Rio, PA-C

## 2017-08-02 ENCOUNTER — Encounter: Payer: Self-pay | Admitting: Physician Assistant

## 2017-08-04 ENCOUNTER — Telehealth: Payer: Self-pay | Admitting: Physician Assistant

## 2017-08-04 NOTE — Telephone Encounter (Signed)
Angela:   PT: 27.4     INR: 2.3  Please call with any changes- may leave message. [phone]

## 2017-08-04 NOTE — Telephone Encounter (Signed)
Goal is between 2-3. Continue current coumadin dosing. Repeat INR 2 weeks.

## 2017-08-05 ENCOUNTER — Telehealth: Payer: Self-pay | Admitting: Emergency Medicine

## 2017-08-05 ENCOUNTER — Other Ambulatory Visit: Payer: Self-pay | Admitting: Pharmacist

## 2017-08-05 DIAGNOSIS — I82621 Acute embolism and thrombosis of deep veins of right upper extremity: Secondary | ICD-10-CM

## 2017-08-05 MED ORDER — WARFARIN SODIUM 5 MG PO TABS
5.0000 mg | ORAL_TABLET | Freq: Two times a day (BID) | ORAL | 3 refills | Status: DC
Start: 2017-08-05 — End: 2017-08-18

## 2017-08-05 NOTE — Progress Notes (Signed)
[facility] pharmacy lab review  

## 2017-08-05 NOTE — Telephone Encounter (Signed)
Left detailed message on VM of continue current dose of coumadin and recheck INR levels in 2 weeks.

## 2017-08-05 NOTE — Telephone Encounter (Signed)
Left message on VM advising patient that rx for the Coumadin [facility]. Contact the office if he has any problems with medication.  Copied from CRM [id number]. Topic: Quick Communication - See Telephone Encounter >> Aug 05, 2017  4:34 PM Herby AbrahamJohnson, Shiquita C wrote: CRM for notification. See Telephone encounter for: 08/05/17.  Pt says that provider sent in Rx for coumadin but pharmacy is unable to fill until a later date. Pt says that he need to be advised because he will be out on Tuesday.   CB: [phone]

## 2017-08-10 ENCOUNTER — Other Ambulatory Visit: Payer: Self-pay | Admitting: Pharmacist

## 2017-08-10 NOTE — Progress Notes (Signed)
[facility] pharmacy lab review  

## 2017-08-11 ENCOUNTER — Encounter: Payer: Self-pay | Admitting: Infectious Diseases

## 2017-08-12 NOTE — Assessment & Plan Note (Signed)
Records reviewed in EMR and Care Everywhere. 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 Needs total of 3 months of anticoagulation. Will finish three months course by the end of August.  Will continue coumadin and take over INR checks. The last 2 have been in therapeutic range, will reduce to 1 x week check. If stable will reduce to once every 4 weeks. Follow-up 1 months.

## 2017-08-12 NOTE — Assessment & Plan Note (Signed)
Stable. Medications refilled. Will monitor closely.

## 2017-08-12 NOTE — Assessment & Plan Note (Signed)
Followed by Orthopedics and [facility]. Will be establishing with new ID practice [facility] next [facility]. Continue care discussed by specialist. Will make sure wound care is added to list of home health services.

## 2017-08-15 ENCOUNTER — Inpatient Hospital Stay: Payer: Medicare Other | Admitting: Internal Medicine

## 2017-08-15 ENCOUNTER — Telehealth: Payer: Self-pay | Admitting: Physician Assistant

## 2017-08-15 NOTE — Telephone Encounter (Signed)
Yes looks like medication is prescribed by [facility]. I will contact [facility] Nurse [place] to contact ID provider for further instructions

## 2017-08-15 NOTE — Telephone Encounter (Signed)
Who is managing vancomycin dosing?? ID??

## 2017-08-15 NOTE — Telephone Encounter (Signed)
Copied from CRM [id number]. Topic: General - Other >> Aug 15, 2017 12:01 PM Debroah LoopLander, Lumin L wrote: Reason for CRM: Crystal, RN with [facility] calling because [facility] and unable to collect labs. Patient has to have vancomyacin trough and they need instruction.

## 2017-08-15 NOTE — Telephone Encounter (Signed)
Please advise of instructions.

## 2017-08-15 NOTE — Telephone Encounter (Signed)
Spoke with Everitt AmberAngela, [facility] [place], regarding lab draw; she says that the information has been sent to provider and someone will call shortly

## 2017-08-16 ENCOUNTER — Encounter: Payer: Self-pay | Admitting: Physician Assistant

## 2017-08-18 ENCOUNTER — Other Ambulatory Visit: Payer: Self-pay | Admitting: Emergency Medicine

## 2017-08-18 ENCOUNTER — Telehealth: Payer: Self-pay | Admitting: Physician Assistant

## 2017-08-18 ENCOUNTER — Encounter: Payer: Self-pay | Admitting: Physician Assistant

## 2017-08-18 DIAGNOSIS — I82621 Acute embolism and thrombosis of deep veins of right upper extremity: Secondary | ICD-10-CM

## 2017-08-18 MED ORDER — WARFARIN SODIUM 5 MG PO TABS: 5.0000 mg | ORAL_TABLET | Freq: Two times a day (BID) | ORAL | 6 refills | Status: DC

## 2017-08-18 NOTE — Telephone Encounter (Signed)
Copied from CRM [id number]. Topic: Quick Communication - See Telephone Encounter >> Aug 18, 2017 10:16 AM Floria RavelingStovall, Shana A wrote: CRM for notification. See Telephone encounter for: 08/18/17.  Pt called in and stated that the [facility] not have the warfarin (COUMADIN) 5 MG tablet [[id number]][[id number]] .  He has checked 3 times and they do not have it.  Can this be resent?   Pharmacy -[facility] [phone]#[id number] - [place], [place] - [address] [phone] (Phone)  Best number [phone]

## 2017-08-18 NOTE — Telephone Encounter (Signed)
Please leave message if Marylene Landngela, RN does not pick up.

## 2017-08-18 NOTE — Telephone Encounter (Signed)
Would have him increase next coumadin dose to 15 mg and then resume regular 10 mg daily.  Repeat coumadin in 1 week.  Ok to send in refills.

## 2017-08-18 NOTE — Telephone Encounter (Signed)
Marylene LandAngela, [facility] called with lab results: PT  17.0 and INR  1.4 Also is requesting Coumadin to be called in today and please let Marylene Landngela know. Her # is  [phone] Notified flow at  [facility]. Will route to the office.

## 2017-08-18 NOTE — Telephone Encounter (Signed)
Kathlee NationsAdvised Angela RN at [facility] of having patient to increase Coumadin next dose to 15 mg then back to 10 mg daily. Recheck INR levels in 1 week. She is agreeable. [facility]

## 2017-08-18 NOTE — Telephone Encounter (Signed)
This encounter was created in error - please disregard.

## 2017-08-18 NOTE — Telephone Encounter (Signed)
Coumadin will filled to pharmacy today. Just received INR results.

## 2017-08-19 ENCOUNTER — Other Ambulatory Visit: Payer: Self-pay | Admitting: Pharmacist

## 2017-08-19 NOTE — Progress Notes (Signed)
[facility] pharmacy lab review  

## 2017-08-26 ENCOUNTER — Telehealth: Payer: Self-pay | Admitting: Emergency Medicine

## 2017-08-26 NOTE — Telephone Encounter (Signed)
Marylene LandAngela RN calling from [facility] with patient PT/INR results  INR: 1.9 Marylene Landngela states is ok to leave instructions on voicemail  Please advise

## 2017-08-26 NOTE — Telephone Encounter (Signed)
Patient currently takes 10 mg daily. Have him take 2.5 tablets (12.5 mg) for next dose (tomorrow if already taken today's dose) then resume 10 mg daily. Repeat INR 1 week.

## 2017-08-26 NOTE — Telephone Encounter (Signed)
Left detailed message on Randy Figueroa voicemail advising for his next dose to take 2.5 tablets (12.5 mg) then resume wit taking 10 mg daily. Repeat INR in 1 week per PCP

## 2017-09-07 ENCOUNTER — Ambulatory Visit: Payer: Self-pay

## 2017-09-07 ENCOUNTER — Ambulatory Visit (INDEPENDENT_AMBULATORY_CARE_PROVIDER_SITE_OTHER): Payer: Medicare Other | Admitting: Physician Assistant

## 2017-09-07 ENCOUNTER — Other Ambulatory Visit: Payer: Self-pay

## 2017-09-07 ENCOUNTER — Encounter: Payer: Self-pay | Admitting: Physician Assistant

## 2017-09-07 VITALS — BP 110/68 | HR 104 | Temp 98.3°F | Resp 16 | Ht 65.0 in | Wt 255.0 lb

## 2017-09-07 DIAGNOSIS — K7689 Other specified diseases of liver: Secondary | ICD-10-CM

## 2017-09-07 DIAGNOSIS — R17 Unspecified jaundice: Secondary | ICD-10-CM

## 2017-09-07 DIAGNOSIS — I82621 Acute embolism and thrombosis of deep veins of right upper extremity: Secondary | ICD-10-CM

## 2017-09-07 LAB — COMPREHENSIVE METABOLIC PANEL
ALK PHOS: 215 U/L — AB (ref 39–117)
ALT: 285 U/L — AB (ref 0–53)
AST: 74 U/L — ABNORMAL HIGH (ref 0–37)
Albumin: 4.2 g/dL (ref 3.5–5.2)
BILIRUBIN TOTAL: 3.6 mg/dL — AB (ref 0.2–1.2)
BUN: 18 mg/dL (ref 6–23)
CO2: 20 mEq/L (ref 19–32)
Calcium: 9.3 mg/dL (ref 8.4–10.5)
Chloride: 102 mEq/L (ref 96–112)
Creatinine, Ser: 1.05 mg/dL (ref 0.40–1.50)
GFR: 77.67 mL/min (ref >60.00)
Glucose, Bld: 116 mg/dL — ABNORMAL HIGH (ref 70–99)
POTASSIUM: 3.7 meq/L (ref 3.5–5.1)
Sodium: 134 mEq/L — ABNORMAL LOW (ref 135–145)
TOTAL PROTEIN: 7.3 g/dL (ref 6.0–8.3)

## 2017-09-07 LAB — POCT URINALYSIS DIPSTICK
Glucose, UA: NEGATIVE
KETONES UA: NEGATIVE
Leukocytes, UA: NEGATIVE
NITRITE UA: NEGATIVE
PH UA: 5 (ref 5.0–8.0)
PROTEIN UA: POSITIVE — AB
RBC UA: NEGATIVE
Spec Grav, UA: 1.015 (ref 1.010–1.025)
Urobilinogen, UA: 0.2 E.U./dL

## 2017-09-07 NOTE — Patient Instructions (Signed)
Please go to the lab today for blood work.  I will call you with your results. We will alter treatment regimen(s) if indicated by your results.   No tylenol-containing products.  No alcohol. Bactrim (sulfa drugs) -- never again! Keep very well-hydrated.  Symptoms should continue to improve and resolve but if you note any worsening jaundice (yellowing of the skin or eye), any shortness of breath or fatigue, please go to the ER.  I am rechecking levels today. If worsening we may need to post[place] [facility] [place]. I imagine your specialist who drew the labs yesterday will be calling your shortly.  Please let them know you have seen us this morning as well.

## 2017-09-07 NOTE — Progress Notes (Signed)
Patient presents to clinic today for follow-up regarding DVT. Patient has just finished up a 3 month course of coumadin for 1st and only DVT (provoked) following surgery. No residual swelling or pain. 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 Was previously on Vacomycin, but changed last week to Bactrim. Noted this caused him significant itching, nausea, stool changes and dark urine. As such they stopped the Bactrim and switched the patient to [facility]. He notes he had labs drawn yesterday to check liver function but they have not resulted yet. Notes that his itching is almost completely resolve. No residual nausea or stool changes but is still having some darker urine. Denies any myalgias.   Past Medical History:  Diagnosis Date  . Anxiety   . Arthritis    "knees" (07/19/2017)  . Cellulitis of right leg 06/2017  . Chronic lower back pain    "L5-S1; I've had injections"  . DVT (deep venous thrombosis) ([place]) ~ 04/2017   RUE "when PICC was removed"  . Family history of adverse reaction to anesthesia    "mother has hard time coming out of it; gets PONV" (07/19/2017)  . GERD (gastroesophageal reflux disease)   . History of blood transfusion    "I believe I have had one; related to one of the knee ORs" (07/19/2017)    Current Outpatient Medications on File Prior to Visit  Medication Sig Dispense Refill  . acetaminophen (TYLENOL) 500 MG tablet Take 500 mg by mouth every 6 (six) hours as needed.    . busPIRone (BUSPAR) 7.5 MG tablet Take 2 tablets (15 mg total) by mouth 2 (two) times daily. 120 tablet 5  . cefpodoxime (VANTIN) 200 MG tablet   3  . EPINEPHrine (EPIPEN 2-PAK) 0.3 mg/0.3 mL IJ SOAJ injection Inject 1 pen into the muscle daily as needed for anaphylaxis.    Marland Kitchen gabapentin (NEURONTIN) 300 MG capsule Take 1 capsule (300 mg total) by mouth 3 (three) times daily. 90 capsule 3  . oxyCODONE (OXY IR/ROXICODONE) 5 MG immediate release tablet Take 5-10  mg by mouth every 4 (four) hours as needed for pain.  0  . pantoprazole (PROTONIX) 40 MG tablet Take 40 mg by mouth daily.  3  . traMADol (ULTRAM) 50 MG tablet Take 1 tablet by mouth 2 (two) times daily.    Marland Kitchen warfarin (COUMADIN) 5 MG tablet Take 1 tablet (5 mg total) by mouth 2 (two) times daily. 60 tablet 6   No current facility-administered medications on file prior to visit.     Allergies  Allergen Reactions  . Levofloxacin Anaphylaxis    Per Care Everywhere  . Tape Rash    Use paper tape and if a picc line is needed use the kind without the white border  . Amoxicillin Swelling    Has patient had a PCN reaction causing immediate rash, facial/tongue/throat swelling, SOB or lightheadedness with hypotension: Yes Has patient had a PCN reaction causing severe rash involving mucus membranes or skin necrosis: No Has patient had a PCN reaction that required hospitalization: No Has patient had a PCN reaction occurring within the last 10 years: No' If all of the above answers are "NO", then may proceed with Cephalosporin use.     History reviewed. No pertinent family history.  Social History   Socioeconomic History  . Marital status: Married    Spouse name: Not on file  . Number of children: Not on file  . Years of education: Not on  file  . Highest education level: Not on file  Occupational History  . Not on file  Social Needs  . Financial resource strain: Not on file  . Food insecurity:    Worry: Not on file    Inability: Not on file  . Transportation needs:    Medical: Not on file    Non-medical: Not on file  Tobacco Use  . Smoking status: Former Smoker    Packs/day: 1.00    Years: 20.00    Pack years: 20.00    Types: Cigarettes    Last attempt to quit: 1998    Years since quitting: 21.6  . Smokeless tobacco: Never Used  Substance and Sexual Activity  . Alcohol use: Not Currently  . Drug use: Never  . Sexual activity: Not Currently  Lifestyle  . Physical activity:      Days per week: Not on file    Minutes per session: Not on file  . Stress: Not on file  Relationships  . Social connections:    Talks on phone: Not on file    Gets together: Not on file    Attends religious service: Not on file    Active member of club or organization: Not on file    Attends meetings of clubs or organizations: Not on file    Relationship status: Not on file  Other Topics Concern  . Not on file  Social History Narrative  . Not on file   Review of Systems - See HPI.  All other ROS are negative.  BP 110/68   Temp 98.3 F (36.8 C) (Oral)   Resp 16   Ht _0  (1.651 m)   Wt 255 lb (115.7 kg)   BMI 42.43 kg/m   Physical Exam  Constitutional: He is oriented to person, place, and time. He appears well-developed and well-nourished.  HENT:  Head: Normocephalic and atraumatic.  Right Ear: External ear normal.  Left Ear: External ear normal.  Eyes: Pupils are equal, round, and reactive to light. EOM are normal. Scleral icterus (mild) is present.  Cardiovascular: Normal rate, regular rhythm, normal heart sounds and intact distal pulses.  Negative for edema of extremities.  Pulmonary/Chest: Effort normal and breath sounds normal.  Abdominal: Soft. Bowel sounds are normal. He exhibits no distension. There is no tenderness.  Neurological: He is alert and oriented to person, place, and time.  Skin:  Mild central truncal jaundice noted on examination  Vitals reviewed.   Recent Results (from the past 2160 hour(s))  Comprehensive metabolic panel     Status: Abnormal   Collection Time: 07/05/17 12:47 PM  Result Value Ref Range   Sodium 140 135 - 145 mmol/L   Potassium 3.4 (L) 3.5 - 5.1 mmol/L   Chloride 102 101 - 111 mmol/L   CO2 27 22 - 32 mmol/L   Glucose, Bld 110 (H) 65 - 99 mg/dL   BUN 15 6 - 20 mg/dL   Creatinine, Ser 1.12 0.61 - 1.24 mg/dL   Calcium 9.2 8.9 - 10.3 mg/dL   Total Protein 7.7 6.5 - 8.1 g/dL   Albumin 3.5 3.5 - 5.0 g/dL   AST 14 (L) 15 - 41  U/L   ALT 14 (L) 17 - 63 U/L   Alkaline Phosphatase 92 38 - 126 U/L   Total Bilirubin 0.4 0.3 - 1.2 mg/dL   GFR calc non Af Amer >60 >60 mL/min   GFR calc Af Amer >60 >60 mL/min    Comment: (NOTE) The  eGFR has been calculated using the CKD EPI equation. This calculation has not been validated in all clinical situations. eGFR's persistently <60 mL/min signify possible Chronic Kidney Disease.    Anion gap 11 5 - 15    Comment: Performed at [facility] [address], [place], [place] [address]  CBC with Differential     Status: Abnormal   Collection Time: 07/05/17 12:47 PM  Result Value Ref Range   WBC 10.3 4.0 - 10.5 K/uL   RBC 3.94 (L) 4.22 - 5.81 MIL/uL   Hemoglobin 10.4 (L) 13.0 - 17.0 g/dL   HCT 33.6 (L) 39.0 - 52.0 %   MCV 85.3 78.0 - 100.0 fL   MCH 26.4 26.0 - 34.0 pg   MCHC 31.0 30.0 - 36.0 g/dL   RDW 15.2 11.5 - 15.5 %   Platelets 318 150 - 400 K/uL   Neutrophils Relative % 67 %   Neutro Abs 6.9 1.7 - 7.7 K/uL   Lymphocytes Relative 15 %   Lymphs Abs 1.6 0.7 - 4.0 K/uL   Monocytes Relative 15 %   Monocytes Absolute 1.5 (H) 0.1 - 1.0 K/uL   Eosinophils Relative 3 %   Eosinophils Absolute 0.3 0.0 - 0.7 K/uL   Basophils Relative 1 %   Basophils Absolute 0.1 0.0 - 0.1 K/uL   Immature Granulocytes 1 %   Abs Immature Granulocytes 0.1 0.0 - 0.1 K/uL    Comment: Performed at [facility] [address], [place], [place] [address]  Protime-INR     Status: Abnormal   Collection Time: 07/05/17 12:47 PM  Result Value Ref Range   Prothrombin Time 26.5 (H) 11.4 - 15.2 seconds   INR 2.46     Comment: Performed at [facility] [address], [place], [place] [address]  I-Stat CG4 Lactic Acid, ED     Status: None   Collection Time: 07/05/17 12:55 PM  Result Value Ref Range   Lactic Acid, Venous 1.50 0.5 - 1.9 mmol/L  Urinalysis, Routine w reflex microscopic     Status: Abnormal   Collection Time: 07/05/17  3:10 PM  Result Value Ref Range   Color, Urine  YELLOW YELLOW   APPearance CLEAR CLEAR   Specific Gravity, Urine 1.028 1.005 - 1.030   pH 5.0 5.0 - 8.0   Glucose, UA NEGATIVE NEGATIVE mg/dL   Hgb urine dipstick MODERATE (A) NEGATIVE   Bilirubin Urine NEGATIVE NEGATIVE   Ketones, ur NEGATIVE NEGATIVE mg/dL   Protein, ur 30 (A) NEGATIVE mg/dL   Nitrite NEGATIVE NEGATIVE   Leukocytes, UA NEGATIVE NEGATIVE   RBC / HPF 11-20 0 - 5 RBC/hpf   WBC, UA 0-5 0 - 5 WBC/hpf   Bacteria, UA RARE (A) NONE SEEN   Mucus PRESENT     Comment: Performed at [facility], [address] [place], [place] [address]  Basic metabolic panel     Status: Abnormal   Collection Time: 07/06/17  5:43 AM  Result Value Ref Range   Sodium 139 135 - 145 mmol/L   Potassium 3.6 3.5 - 5.1 mmol/L   Chloride 104 101 - 111 mmol/L   CO2 28 22 - 32 mmol/L   Glucose, Bld 114 (H) 65 - 99 mg/dL   BUN 13 6 - 20 mg/dL   Creatinine, Ser 0.99 0.61 - 1.24 mg/dL   Calcium 8.7 (L) 8.9 - 10.3 mg/dL   GFR calc non Af Amer >60 >60 mL/min   GFR  calc Af Amer >60 >60 mL/min    Comment: (NOTE) The eGFR has been calculated using the CKD EPI equation. This calculation has not been validated in all clinical situations. eGFR's persistently <60 mL/min signify possible Chronic Kidney Disease.    Anion gap 7 5 - 15    Comment: Performed at [facility] [address], [place], [place] [address]  CBC     Status: Abnormal   Collection Time: 07/06/17  5:43 AM  Result Value Ref Range   WBC 8.7 4.0 - 10.5 K/uL   RBC 3.69 (L) 4.22 - 5.81 MIL/uL   Hemoglobin 9.5 (L) 13.0 - 17.0 g/dL   HCT 31.2 (L) 39.0 - 52.0 %   MCV 84.6 78.0 - 100.0 fL   MCH 25.7 (L) 26.0 - 34.0 pg   MCHC 30.4 30.0 - 36.0 g/dL   RDW 15.4 11.5 - 15.5 %   Platelets 281 150 - 400 K/uL    Comment: Performed at [facility], [place] [address] [place], [place] [address]  Protime-INR     Status: Abnormal   Collection Time: 07/06/17  5:43 AM  Result Value Ref Range   Prothrombin Time 28.5 (H) 11.4 - 15.2  seconds   INR 2.70     Comment: Performed at [facility] [address], [place], [place] [address]  HIV antibody     Status: None   Collection Time: 07/06/17  5:43 AM  Result Value Ref Range   HIV Screen 4th Generation wRfx Non Reactive Non Reactive    Comment: (NOTE) Performed At: [facility] [place], [place] [id number] Rush Farmer MD [id number] Performed at [facility], [place] [address] [place], [place] [address]   CBC     Status: Abnormal   Collection Time: 07/07/17  5:55 AM  Result Value Ref Range   WBC 9.6 4.0 - 10.5 K/uL   RBC 4.14 (L) 4.22 - 5.81 MIL/uL   Hemoglobin 10.7 (L) 13.0 - 17.0 g/dL   HCT 35.1 (L) 39.0 - 52.0 %   MCV 84.8 78.0 - 100.0 fL   MCH 25.8 (L) 26.0 - 34.0 pg   MCHC 30.5 30.0 - 36.0 g/dL   RDW 15.5 11.5 - 15.5 %   Platelets 350 150 - 400 K/uL    Comment: Performed at [facility], [place] [address], [place], [place] [address]  Protime-INR     Status: Abnormal   Collection Time: 07/07/17  5:55 AM  Result Value Ref Range   Prothrombin Time 25.5 (H) 11.4 - 15.2 seconds   INR 2.35     Comment: Performed at [facility] [address], [place], [place] [address]  Basic metabolic panel     Status: Abnormal   Collection Time: 07/07/17  5:55 AM  Result Value Ref Range   Sodium 140 135 - 145 mmol/L   Potassium 3.6 3.5 - 5.1 mmol/L   Chloride 102 101 - 111 mmol/L   CO2 26 22 - 32 mmol/L   Glucose, Bld 106 (H) 65 - 99 mg/dL   BUN 12 6 - 20 mg/dL   Creatinine, Ser 1.02 0.61 - 1.24 mg/dL   Calcium 9.1 8.9 - 10.3 mg/dL   GFR calc non Af Amer >60 >60 mL/min   GFR calc Af Amer >60 >60 mL/min    Comment: (NOTE) The eGFR has been calculated using the CKD EPI equation. This calculation has not been validated in all  clinical situations. eGFR's persistently <60 mL/min signify possible Chronic Kidney Disease.    Anion gap 12 5 - 15    Comment: Performed at [facility] [address]  [place], [place] [address]  Vancomycin, trough     Status: Abnormal   Collection Time: 07/07/17  5:55 AM  Result Value Ref Range   Vancomycin Tr 12 (L) 15 - 20 ug/mL    Comment: Performed at [facility], [place] [address], [place], [place] [address]  INR     Status: Abnormal   Collection Time: 07/11/17  1:40 PM  Result Value Ref Range   INR 3.9 (A) 2.0 - 3.0    Comment: 47.3 seconds  Comprehensive metabolic panel     Status: Abnormal   Collection Time: 07/17/17 10:38 AM  Result Value Ref Range   Sodium 140 135 - 145 mmol/L   Potassium 3.4 (L) 3.5 - 5.1 mmol/L   Chloride 103 98 - 111 mmol/L    Comment: Please note change in reference range.   CO2 25 22 - 32 mmol/L   Glucose, Bld 109 (H) 70 - 99 mg/dL    Comment: Please note change in reference range.   BUN 18 6 - 20 mg/dL    Comment: Please note change in reference range.   Creatinine, Ser 1.01 0.61 - 1.24 mg/dL   Calcium 9.0 8.9 - 10.3 mg/dL   Total Protein 7.7 6.5 - 8.1 g/dL   Albumin 3.3 (L) 3.5 - 5.0 g/dL   AST 16 15 - 41 U/L   ALT 14 0 - 44 U/L    Comment: Please note change in reference range.   Alkaline Phosphatase 71 38 - 126 U/L   Total Bilirubin 0.4 0.3 - 1.2 mg/dL   GFR calc non Af Amer >60 >60 mL/min   GFR calc Af Amer >60 >60 mL/min    Comment: (NOTE) The eGFR has been calculated using the CKD EPI equation. This calculation has not been validated in all clinical situations. eGFR's persistently <60 mL/min signify possible Chronic Kidney Disease.    Anion gap 12 5 - 15    Comment: Performed at [facility] [address] [place], [place] [address]  CBC with Differential     Status: Abnormal   Collection Time: 07/17/17 10:38 AM  Result Value Ref Range   WBC 7.6 4.0 - 10.5 K/uL   RBC 3.98 (L) 4.22 - 5.81 MIL/uL   Hemoglobin 10.0 (L) 13.0 - 17.0 g/dL   HCT 34.0 (L) 39.0 - 52.0 %   MCV 85.4 78.0 - 100.0 fL   MCH 25.1 (L) 26.0 - 34.0 pg   MCHC 29.4 (L) 30.0 - 36.0 g/dL   RDW 15.5 11.5 - 15.5 %    Platelets 686 (H) 150 - 400 K/uL   Neutrophils Relative % 64 %   Neutro Abs 4.7 1.7 - 7.7 K/uL   Lymphocytes Relative 22 %   Lymphs Abs 1.7 0.7 - 4.0 K/uL   Monocytes Relative 10 %   Monocytes Absolute 0.8 0.1 - 1.0 K/uL   Eosinophils Relative 2 %   Eosinophils Absolute 0.2 0.0 - 0.7 K/uL   Basophils Relative 1 %   Basophils Absolute 0.1 0.0 - 0.1 K/uL   Immature Granulocytes 1 %   Abs Immature Granulocytes 0.1 0.0 - 0.1 K/uL    Comment: Performed at [facility], [address] [place], [place] [address]  Protime-INR     Status:  Abnormal   Collection Time: 07/17/17 10:38 AM  Result Value Ref Range   Prothrombin Time 21.1 (H) 11.4 - 15.2 seconds   INR 1.84     Comment: Performed at [facility] [address], [place], [place] [address]  I-Stat CG4 Lactic Acid, ED     Status: None   Collection Time: 07/17/17 10:52 AM  Result Value Ref Range   Lactic Acid, Venous 1.48 0.5 - 1.9 mmol/L  Aerobic Culture (superficial specimen)     Status: None   Collection Time: 07/17/17  1:09 PM  Result Value Ref Range   Specimen Description KNEE    Special Requests NONE    Gram Stain      NO WBC SEEN NO ORGANISMS SEEN Performed at [facility], [place] [address], [place], [place] [address]    Culture FEW ENTEROBACTER CLOACAE    Report Status 07/19/2017 FINAL    Organism ID, Bacteria ENTEROBACTER CLOACAE       Susceptibility   Enterobacter cloacae - MIC*    CEFAZOLIN >=64 RESISTANT Resistant     CEFEPIME <=1 SENSITIVE Sensitive     CEFTAZIDIME <=1 SENSITIVE Sensitive     CEFTRIAXONE <=1 SENSITIVE Sensitive     CIPROFLOXACIN <=0.25 SENSITIVE Sensitive     GENTAMICIN <=1 SENSITIVE Sensitive     IMIPENEM 0.5 SENSITIVE Sensitive     TRIMETH/SULFA <=20 SENSITIVE Sensitive     PIP/TAZO <=4 SENSITIVE Sensitive     * FEW ENTEROBACTER CLOACAE  Sedimentation rate     Status: Abnormal   Collection Time: 07/17/17  3:11 PM  Result Value Ref Range   Sed Rate 105 (H) 0 - 16 mm/hr      Comment: Performed at [facility] [address], [place], [place] [address]  C-reactive protein     Status: Abnormal   Collection Time: 07/17/17  3:11 PM  Result Value Ref Range   CRP 7.1 (H) <1.0 mg/dL    Comment: Performed at [facility], [place] [address] [place], [place] [address]  Blood culture (routine x 2)     Status: None   Collection Time: 07/17/17  3:12 PM  Result Value Ref Range   Specimen Description BLOOD RIGHT ANTECUBITAL    Special Requests      BOTTLES DRAWN AEROBIC ONLY Blood Culture results may not be optimal due to an inadequate volume of blood received in culture bottles   Culture      NO GROWTH 5 DAYS Performed at [facility] [address] [place], [place] [address]    Report Status 07/22/2017 FINAL   Blood culture (routine x 2)     Status: None   Collection Time: 07/17/17  3:12 PM  Result Value Ref Range   Specimen Description BLOOD RIGHT ANTECUBITAL    Special Requests      BOTTLES DRAWN AEROBIC AND ANAEROBIC Blood Culture adequate volume   Culture      NO GROWTH 5 DAYS Performed at [facility], [place] [address], [place],  [address]    Report Status 07/22/2017 FINAL   Surgical pcr screen     Status: None   Collection Time: 07/17/17 11:43 PM  Result Value Ref Range   MRSA, PCR NEGATIVE NEGATIVE   Staphylococcus aureus NEGATIVE NEGATIVE    Comment: (NOTE) The Xpert SA Assay (FDA approved for NASAL specimens in patients 6 years of age and older), is one component of a comprehensive surveillance program. It  is not intended to diagnose infection nor to guide or monitor treatment. Performed at [facility], [place] [address], [place], [place] [address]   Comprehensive metabolic panel     Status: Abnormal   Collection Time: 07/18/17  4:04 AM  Result Value Ref Range   Sodium 138 135 - 145 mmol/L   Potassium 3.9 3.5 - 5.1 mmol/L   Chloride 103 98 - 111 mmol/L    Comment: Please note change in reference range.    CO2 28 22 - 32 mmol/L   Glucose, Bld 113 (H) 70 - 99 mg/dL    Comment: Please note change in reference range.   BUN 14 6 - 20 mg/dL    Comment: Please note change in reference range.   Creatinine, Ser 0.99 0.61 - 1.24 mg/dL   Calcium 8.7 (L) 8.9 - 10.3 mg/dL   Total Protein 6.6 6.5 - 8.1 g/dL   Albumin 2.9 (L) 3.5 - 5.0 g/dL   AST 15 15 - 41 U/L   ALT 13 0 - 44 U/L    Comment: Please note change in reference range.   Alkaline Phosphatase 63 38 - 126 U/L   Total Bilirubin 0.5 0.3 - 1.2 mg/dL   GFR calc non Af Amer >60 >60 mL/min   GFR calc Af Amer >60 >60 mL/min    Comment: (NOTE) The eGFR has been calculated using the CKD EPI equation. This calculation has not been validated in all clinical situations. eGFR's persistently <60 mL/min signify possible Chronic Kidney Disease.    Anion gap 7 5 - 15    Comment: Performed at [facility] [address] [place], [place] [address]  Protime-INR     Status: Abnormal   Collection Time: 07/19/17  4:09 AM  Result Value Ref Range   Prothrombin Time 18.0 (H) 11.4 - 15.2 seconds   INR 1.50     Comment: Performed at [facility] [address], [place], [place] [address]  CBC     Status: Abnormal   Collection Time: 07/19/17  4:09 AM  Result Value Ref Range   WBC 7.0 4.0 - 10.5 K/uL   RBC 3.98 (L) 4.22 - 5.81 MIL/uL   Hemoglobin 10.2 (L) 13.0 - 17.0 g/dL   HCT 33.4 (L) 39.0 - 52.0 %   MCV 83.9 78.0 - 100.0 fL   MCH 25.6 (L) 26.0 - 34.0 pg   MCHC 30.5 30.0 - 36.0 g/dL   RDW 15.9 (H) 11.5 - 15.5 %   Platelets 540 (H) 150 - 400 K/uL    Comment: Performed at [facility], [place] [address], [place], [place] [address]  Aerobic/Anaerobic Culture (surgical/deep wound)     Status: None   Collection Time: 07/19/17  1:42 PM  Result Value Ref Range   Specimen Description WOUND    Special Requests NONE    Gram Stain      ABUNDANT WBC PRESENT, PREDOMINANTLY PMN NO ORGANISMS SEEN Gram Stain Report Called to,Read Back By and  Verified With: DR J CAMPBELL 1511 07/19/17 A BROWNING    Culture      FEW ENTEROBACTER CLOACAE NO ANAEROBES ISOLATED Performed at [facility], [place] [address] [place], [place] [address]    Report Status 07/24/2017 FINAL    Organism ID, Bacteria ENTEROBACTER CLOACAE       Susceptibility   Enterobacter cloacae - MIC*    CEFAZOLIN >=64 RESISTANT Resistant     CEFEPIME <=1 SENSITIVE Sensitive  CEFTAZIDIME <=1 SENSITIVE Sensitive     CEFTRIAXONE <=1 SENSITIVE Sensitive     CIPROFLOXACIN <=0.25 SENSITIVE Sensitive     GENTAMICIN <=1 SENSITIVE Sensitive     IMIPENEM 0.5 SENSITIVE Sensitive     TRIMETH/SULFA <=20 SENSITIVE Sensitive     PIP/TAZO <=4 SENSITIVE Sensitive     * FEW ENTEROBACTER CLOACAE  Protime-INR     Status: Abnormal   Collection Time: 07/20/17  3:23 AM  Result Value Ref Range   Prothrombin Time 16.0 (H) 11.4 - 15.2 seconds   INR 1.29     Comment: Performed at [facility] [address] [place], [place] [address]  CBC     Status: Abnormal   Collection Time: 07/20/17  3:23 AM  Result Value Ref Range   WBC 7.2 4.0 - 10.5 K/uL   RBC 3.84 (L) 4.22 - 5.81 MIL/uL   Hemoglobin 9.8 (L) 13.0 - 17.0 g/dL   HCT 31.7 (L) 39.0 - 52.0 %   MCV 82.6 78.0 - 100.0 fL   MCH 25.5 (L) 26.0 - 34.0 pg   MCHC 30.9 30.0 - 36.0 g/dL   RDW 15.6 (H) 11.5 - 15.5 %   Platelets 497 (H) 150 - 400 K/uL    Comment: Performed at [facility], [place] [address], [place], [place] [address]  Basic metabolic panel     Status: Abnormal   Collection Time: 07/20/17  3:23 AM  Result Value Ref Range   Sodium 139 135 - 145 mmol/L   Potassium 3.7 3.5 - 5.1 mmol/L   Chloride 106 98 - 111 mmol/L    Comment: Please note change in reference range.   CO2 25 22 - 32 mmol/L   Glucose, Bld 105 (H) 70 - 99 mg/dL    Comment: Please note change in reference range.   BUN 12 6 - 20 mg/dL    Comment: Please note change in reference range.   Creatinine, Ser 0.93 0.61 - 1.24 mg/dL   Calcium 8.7  (L) 8.9 - 10.3 mg/dL   GFR calc non Af Amer >60 >60 mL/min   GFR calc Af Amer >60 >60 mL/min    Comment: (NOTE) The eGFR has been calculated using the CKD EPI equation. This calculation has not been validated in all clinical situations. eGFR's persistently <60 mL/min signify possible Chronic Kidney Disease.    Anion gap 8 5 - 15    Comment: Performed at [facility] [address] [place], [place] [address]   Assessment/Plan: 1. Jaundice of recent onset 2. 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 Has been discontinued. Had pruritus and dark urine. Pruritus is resolved per patient. Still having darker urine but notes is lighter than previous days. Denies abdominal pain, nausea/vomiting, fever.  Labs yesterday from [facility]) revealed elevated aminotransferases (AST 97 and ALT 390s), elevated Alk Phos (214) and elevated total bilirubin (3.0). Patient notes he has not been notified of these results thus far. Mild jaundice on examination. Will check CBC w diff and repeat CMP today to see if levels are improving. If still climbing will need more acute assessment. No alcohol. No tylenol-containing products. Sulfa added to allergy list. Question G6PD ? - Comp Met (CMET) - POCT urinalysis dipstick - CBC w/Diff  3. Deep vein thrombosis (DVT) of right upper extremity, unspecified chronicity, unspecified vein (HCC) 1 episode. Provoked DVT. Has completed 3 months of anticoagulation - coumadin. Was taken off of coumadin last week by ID. Ok to remain  off coumadin.     Leeanne Rio, PA-C

## 2017-09-07 NOTE — Telephone Encounter (Signed)
Spoke with Patient in regards to bloodwork. [facility] I discussed with patient other alternatives for Pain, rather than tylenol. Patient verbalized understanding.   Kathi SimpersAmy Peterman,  LPN

## 2017-09-07 NOTE — Telephone Encounter (Signed)
Incoming call from patient inquiring as to what his lab results were  from this am and what he is allowed to take for pain? Researched for lab results.  Not available. Patient also questions what he may take for pain . Concerned about if he may continue to take Tylenol or not.  Patient has taken 2 Tylenol today.    During phone call pain was rated 5.  Informed patient that I would route the conversation to office for PCP to review and advise.  Patient voiced  understanding and awaits response from PCP as to what he may take for pain.       Reason for Disposition . Health Information question, no triage required and triager able to answer question  Answer Assessment - Initial Assessment Questions 1. REASON FOR CALL or QUESTION: "What is your reason for calling today?" or "How can I best help you?" or "What question do you have that I can help answer?"     Questions about medication ?  Protocols used: INFORMATION ONLY CALL-A-AH

## 2017-09-07 NOTE — Addendum Note (Signed)
Addended byDene Gentry: Jessie Schrieber M on: 09/07/2017 02:51 PM   Modules accepted: Orders

## 2017-09-07 NOTE — Telephone Encounter (Signed)
This encounter was created in error - please disregard.

## 2017-09-08 ENCOUNTER — Telehealth: Payer: Self-pay | Admitting: Emergency Medicine

## 2017-09-08 ENCOUNTER — Other Ambulatory Visit (INDEPENDENT_AMBULATORY_CARE_PROVIDER_SITE_OTHER): Payer: Medicare Other

## 2017-09-08 DIAGNOSIS — R17 Unspecified jaundice: Secondary | ICD-10-CM | POA: Diagnosis not present

## 2017-09-08 DIAGNOSIS — K7689 Other specified diseases of liver: Secondary | ICD-10-CM

## 2017-09-08 DIAGNOSIS — I82621 Acute embolism and thrombosis of deep veins of right upper extremity: Secondary | ICD-10-CM

## 2017-09-08 LAB — CBC WITH DIFFERENTIAL/PLATELET
Basophils Absolute: 0.1 10*3/uL (ref 0.0–0.1)
Basophils Relative: 1 % (ref 0.0–3.0)
EOS PCT: 4 % (ref 0.0–5.0)
Eosinophils Absolute: 0.2 10*3/uL (ref 0.0–0.7)
HCT: 35.4 % — ABNORMAL LOW (ref 39.0–52.0)
Hemoglobin: 11.6 g/dL — ABNORMAL LOW (ref 13.0–17.0)
Lymphocytes Relative: 27 % (ref 12.0–46.0)
Lymphs Abs: 1.5 10*3/uL (ref 0.7–4.0)
MCHC: 32.8 g/dL (ref 30.0–36.0)
MCV: 75.3 fl — ABNORMAL LOW (ref 78.0–100.0)
Monocytes Absolute: 0.5 10*3/uL (ref 0.1–1.0)
Monocytes Relative: 9.3 % (ref 3.0–12.0)
NEUTROS ABS: 3.3 10*3/uL (ref 1.4–7.7)
Neutrophils Relative %: 58.7 % (ref 43.0–77.0)
PLATELETS: 318 10*3/uL (ref 150.0–400.0)
RBC: 4.7 Mil/uL (ref 4.22–5.81)
RDW: 19.5 % — AB (ref 11.5–15.5)
WBC: 5.5 10*3/uL (ref 4.0–10.5)

## 2017-09-08 NOTE — Addendum Note (Signed)
Addended byDene Gentry: PETERMAN, AMY M on: 09/08/2017 01:16 PM   Modules accepted: Orders

## 2017-09-08 NOTE — Telephone Encounter (Signed)
Copied from CRM [id number]. Topic: Quick Communication - Lab Results >> Sep 08, 2017  3:24 PM Jonette EvaBarksdale, Harvey B wrote: Contact pt to go over test results

## 2017-09-09 ENCOUNTER — Emergency Department (HOSPITAL_COMMUNITY): Payer: Medicare Other

## 2017-09-09 ENCOUNTER — Emergency Department (HOSPITAL_COMMUNITY)
Admission: EM | Admit: 2017-09-09 | Discharge: 2017-09-10 | Disposition: A | Discharge status: Home / Self Care | Payer: Medicare Other | Attending: Emergency Medicine | Admitting: Emergency Medicine

## 2017-09-09 ENCOUNTER — Other Ambulatory Visit: Payer: Self-pay

## 2017-09-09 ENCOUNTER — Encounter (HOSPITAL_COMMUNITY): Payer: Self-pay | Admitting: Emergency Medicine

## 2017-09-09 DIAGNOSIS — R7989 Other specified abnormal findings of blood chemistry: Secondary | ICD-10-CM

## 2017-09-09 DIAGNOSIS — Z79899 Other long term (current) drug therapy: Secondary | ICD-10-CM | POA: Insufficient documentation

## 2017-09-09 DIAGNOSIS — R945 Abnormal results of liver function studies: Secondary | ICD-10-CM | POA: Insufficient documentation

## 2017-09-09 DIAGNOSIS — E7800 Pure hypercholesterolemia, unspecified: Secondary | ICD-10-CM | POA: Diagnosis not present

## 2017-09-09 DIAGNOSIS — Z86718 Personal history of other venous thrombosis and embolism: Secondary | ICD-10-CM | POA: Insufficient documentation

## 2017-09-09 DIAGNOSIS — Z7901 Long term (current) use of anticoagulants: Secondary | ICD-10-CM | POA: Insufficient documentation

## 2017-09-09 DIAGNOSIS — R17 Unspecified jaundice: Secondary | ICD-10-CM | POA: Insufficient documentation

## 2017-09-09 DIAGNOSIS — Z87891 Personal history of nicotine dependence: Secondary | ICD-10-CM | POA: Diagnosis not present

## 2017-09-09 LAB — URINALYSIS, ROUTINE W REFLEX MICROSCOPIC
Bacteria, UA: NONE SEEN
Glucose, UA: NEGATIVE mg/dL
Ketones, ur: NEGATIVE mg/dL
LEUKOCYTES UA: NEGATIVE
NITRITE: NEGATIVE
PH: 5 (ref 5.0–8.0)
Protein, ur: 30 mg/dL — AB
Specific Gravity, Urine: 1.021 (ref 1.005–1.030)

## 2017-09-09 LAB — CBC WITH DIFFERENTIAL/PLATELET
Abs Immature Granulocytes: 0.1 10*3/uL (ref 0.0–0.1)
BASOS ABS: 0.1 10*3/uL (ref 0.0–0.1)
BASOS PCT: 1 %
EOS ABS: 0.2 10*3/uL (ref 0.0–0.7)
Eosinophils Relative: 5 %
HCT: 37.1 % — ABNORMAL LOW (ref 39.0–52.0)
Hemoglobin: 11.5 g/dL — ABNORMAL LOW (ref 13.0–17.0)
IMMATURE GRANULOCYTES: 1 %
Lymphocytes Relative: 29 %
Lymphs Abs: 1.4 10*3/uL (ref 0.7–4.0)
MCH: 24.3 pg — ABNORMAL LOW (ref 26.0–34.0)
MCHC: 31 g/dL (ref 30.0–36.0)
MCV: 78.4 fL (ref 78.0–100.0)
Monocytes Absolute: 0.5 10*3/uL (ref 0.1–1.0)
Monocytes Relative: 11 %
NEUTROS PCT: 53 %
Neutro Abs: 2.5 10*3/uL (ref 1.7–7.7)
PLATELETS: 324 10*3/uL (ref 150–400)
RBC: 4.73 MIL/uL (ref 4.22–5.81)
RDW: 17.8 % — AB (ref 11.5–15.5)
WBC: 4.7 10*3/uL (ref 4.0–10.5)

## 2017-09-09 LAB — COMPREHENSIVE METABOLIC PANEL
ALK PHOS: 188 U/L — AB (ref 38–126)
ALT: 178 U/L — AB (ref 0–44)
AST: 51 U/L — ABNORMAL HIGH (ref 15–41)
Albumin: 3.6 g/dL (ref 3.5–5.0)
Anion gap: 10 (ref 5–15)
BUN: 14 mg/dL (ref 6–20)
CALCIUM: 9.1 mg/dL (ref 8.9–10.3)
CO2: 22 mmol/L (ref 22–32)
CREATININE: 1.17 mg/dL (ref 0.61–1.24)
Chloride: 106 mmol/L (ref 98–111)
Glucose, Bld: 158 mg/dL — ABNORMAL HIGH (ref 70–99)
Potassium: 3.4 mmol/L — ABNORMAL LOW (ref 3.5–5.1)
Sodium: 138 mmol/L (ref 135–145)
TOTAL PROTEIN: 7.1 g/dL (ref 6.5–8.1)
Total Bilirubin: 4.6 mg/dL — ABNORMAL HIGH (ref 0.3–1.2)

## 2017-09-09 LAB — BILIRUBIN, DIRECT: Bilirubin, Direct: 2.6 mg/dL — ABNORMAL HIGH (ref 0.0–0.2)

## 2017-09-09 LAB — PROTIME-INR
INR: 1.02
Prothrombin Time: 13.3 seconds (ref 11.4–15.2)

## 2017-09-09 LAB — LIPASE, BLOOD: Lipase: 594 U/L — ABNORMAL HIGH (ref 11–51)

## 2017-09-09 MED ORDER — DIPHENHYDRAMINE HCL 25 MG PO CAPS
50.0000 mg | ORAL_CAPSULE | Freq: Once | ORAL | Status: AC
  Administered 2017-09-09: 50 mg via ORAL
  Filled 2017-09-09: qty 2

## 2017-09-09 MED ORDER — CHOLESTYRAMINE 4 G PO PACK
4.0000 g | PACK | Freq: Once | ORAL | Status: AC
  Administered 2017-09-10: 4 g via ORAL
  Filled 2017-09-09: qty 1

## 2017-09-09 MED ORDER — IOPAMIDOL (ISOVUE-300) INJECTION 61%
INTRAVENOUS | Status: AC
  Filled 2017-09-09: qty 100

## 2017-09-09 MED ORDER — IOPAMIDOL (ISOVUE-300) INJECTION 61%
100.0000 mL | Freq: Once | INTRAVENOUS | Status: AC | PRN
  Administered 2017-09-09: 100 mL via INTRAVENOUS

## 2017-09-09 NOTE — ED Provider Notes (Signed)
Patient placed in Quick Look pathway, seen and evaluated   Chief Complaint: Jaundice, dark urine, loose light-colored stools, and upset stomach  HPI:   Patient presents today for concern for abnormal labs.  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  He was taking Bactrim and that was stopped about 2 weeks ago due to concerns of allergic reaction, since then he has been becoming more and more jaundiced with dark urine, loose light-colored stools, and upset stomach.  His primary care doctor took labs today, however they are concerned that he is worsened significantly.  ROS: No fevers  Physical Exam:   Gen: No distress  Neuro: Awake and Alert  Skin: Warm    Focused Exam: Right knee is fused, held straight, he appears jaundiced   Initiation of care has begun. The patient has been counseled on the process, plan, and necessity for staying for the completion/evaluation, and the remainder of the medical screening examination    Norman ClayHammond, Cord Wilczynski W, PA-C 09/09/17 1709    Raeford RazorKohut, Stephen, MD 09/15/17 1433

## 2017-09-09 NOTE — Telephone Encounter (Signed)
Patient advised of lab results. See lab notes

## 2017-09-09 NOTE — ED Provider Notes (Signed)
Randy Figueroa   CSN: [id number] Arrival date & time: 09/09/17  1633     History   Chief Complaint Chief Complaint  Patient presents with  . Jaundice  . Abnormal Lab    Randy Figueroa is a 57 y.o. male.  Randy   57 year old male presenting with painless jaundice.  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  Recently finished several weeks of IV antibiotics and then switched to oral Bactrim DS 2 tablets twice daily.  Right after initiating this therapy he began to not feel well.  Generalized fatigue.  Severe itching.  Pale-colored stools.  Labs were drawn and noted to have abnormal LFTs.  Repeat labs are still elevated he came to the emergency room for further evaluation.  Past Medical History:  Diagnosis Date  . Anxiety   . Arthritis    "knees" (07/19/2017)  . Cellulitis of right leg 06/2017  . Chronic lower back pain    "L5-S1; I've had injections"  . DVT (deep venous thrombosis) (HCC) ~ 04/2017   RUE "when PICC was removed"  . Family history of adverse reaction to anesthesia    "mother has hard time coming out of it; gets PONV" (07/19/2017)  . GERD (gastroesophageal reflux disease)   . History of blood transfusion    "I believe I have had one; related to one of the knee ORs" (07/19/2017)    Patient Active Problem List   Diagnosis Date Noted  . 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 Morbid obesity with BMI of 45.0-49.9, adult (HCC) 03/07/2017  . Medical marijuana use 03/07/2017  . 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 Lumbar spondylosis 02/17/2017  . Chronic low back pain 11/12/2015  . 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 Vitamin D deficiency 01/30/2015  . 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 Osteoarthritis 01/30/2015  .  Hypercholesterolemia 01/30/2015  . Gastroesophageal reflux disease without esophagitis 01/30/2015  . Elevated blood-pressure reading without diagnosis of hypertension 01/30/2015  . Anxiety 01/30/2015  . Infection or inflammatory reaction due to internal joint prosthesis (HCC) 10/17/2013  . Insomnia 09/13/2013  . Psoriasis 03/12/2013  . Ankle pain 08/30/2011    Past Surgical History:  Procedure Laterality Date  . APPENDECTOMY  ~ 1976  . EXCISIONAL TOTAL KNEE ARTHROPLASTY WITH ANTIBIOTIC SPACERS Right   . EXCISIONAL TOTAL KNEE ARTHROPLASTY WITH ANTIBIOTIC SPACERS Right   . EXCISIONAL TOTAL KNEE ARTHROPLASTY WITH ANTIBIOTIC SPACERS Right 01/2017  . I&D KNEE WITH POLY EXCHANGE Right   . JOINT REPLACEMENT    . KNEE FUSION Right 04/2017   "nail from hip to ankle"  . KNEE JOINT MANIPULATION Right   . TOTAL KNEE ARTHROPLASTY Bilateral 2014  . TOTAL KNEE ARTHROPLASTY Right   . TOTAL KNEE ARTHROPLASTY Right   . TUMOR EXCISION Right 1980s   "benign; back of my leg"        Home Medications    Prior to Admission medications   Medication Sig Start Date End Date Taking? Authorizing Provider  acetaminophen (TYLENOL) 500 MG tablet Take 500 mg by mouth every 6 (six) hours as needed.    [provider]  busPIRone (BUSPAR) 7.5 MG tablet Take 2 tablets (15 mg total) by mouth 2 (two) times daily. 08/01/17   Waldon Merl, PA-C  cefpodoxime Varney Baas) 200 MG tablet  09/05/17  [provider]  EPINEPHrine (EPIPEN 2-PAK) 0.3 mg/0.3 mL IJ SOAJ injection Inject 1 pen into the muscle daily as needed for anaphylaxis.    [provider]  gabapentin (NEURONTIN) 300 MG capsule Take 1 capsule (300 mg total) by mouth 3 (three) times daily. 08/01/17   Waldon Merl, PA-C  oxyCODONE (OXY IR/ROXICODONE) 5 MG immediate release tablet Take 5-10 mg by mouth every 4 (four) hours as needed for pain. 06/06/17   [provider]  pantoprazole (PROTONIX) 40 MG tablet Take 40 mg by  mouth daily. 06/21/17   [provider]  traMADol (ULTRAM) 50 MG tablet Take 1 tablet by mouth 2 (two) times daily.    [provider]  warfarin (COUMADIN) 5 MG tablet Take 1 tablet (5 mg total) by mouth 2 (two) times daily. 08/18/17   Waldon Merl, PA-C    Family History No family history on file.  Social History Social History   Tobacco Use  . Smoking status: Former Smoker    Packs/day: 1.00    Years: 20.00    Pack years: 20.00    Types: Cigarettes    Last attempt to quit: 1998    Years since quitting: 21.6  . Smokeless tobacco: Never Used  Substance Use Topics  . Alcohol use: Not Currently  . Drug use: Never     Allergies   Bactrim [sulfamethoxazole-trimethoprim]; Levofloxacin; Tape; and Amoxicillin   Review of Systems Review of Systems  All systems reviewed and negative, other than as noted in Randy.  Physical Exam Updated Vital Signs BP (!) 140/95   Pulse 92   Temp 98.4 F (36.9 C) (Oral)   Resp 18   Ht 5\' 6"  (1.676 m)   Wt 115.7 kg   SpO2 95%   BMI 41.16 kg/m   Physical Exam  Constitutional: He appears well-developed and well-nourished. No distress.  HENT:  Head: Normocephalic and atraumatic.  Eyes: Right eye exhibits no discharge. Left eye exhibits no discharge. Scleral icterus is present.  Neck: Neck supple.  Cardiovascular: Normal rate, regular rhythm and normal heart sounds. Exam reveals no gallop and no friction rub.  No murmur heard. Pulmonary/Chest: Effort normal and breath sounds normal. No respiratory distress.  Abdominal: Soft. He exhibits no distension. There is no tenderness.  Obese abdomen.  Does not really seem distended.  No tenderness.  Musculoskeletal: He exhibits no edema or tenderness.  Evidence of multiple prior surgeries to right lower extremity.  Right knee is fused.  Neurological: He is alert.  Skin: Skin is warm and dry.  Psychiatric: He has a normal mood and affect. His behavior is normal. Thought content  normal.  Nursing Figueroa and vitals reviewed.    ED Treatments / Results  Labs (all labs ordered are listed, but only abnormal results are displayed) Labs Reviewed  COMPREHENSIVE METABOLIC PANEL - Abnormal; Notable for the following components:      Result Value   Potassium 3.4 (*)    Glucose, Bld 158 (*)    AST 51 (*)    ALT 178 (*)    Alkaline Phosphatase 188 (*)    Total Bilirubin 4.6 (*)    All other components within normal limits  CBC WITH DIFFERENTIAL/PLATELET - Abnormal; Notable for the following components:   Hemoglobin 11.5 (*)    HCT 37.1 (*)    MCH 24.3 (*)    RDW 17.8 (*)    All other components within normal limits  URINALYSIS, ROUTINE W REFLEX MICROSCOPIC - Abnormal;  Notable for the following components:   Color, Urine AMBER (*)    Hgb urine dipstick SMALL (*)    Bilirubin Urine MODERATE (*)    Protein, ur 30 (*)    All other components within normal limits  LIPASE, BLOOD - Abnormal; Notable for the following components:   Lipase 594 (*)    All other components within normal limits  PROTIME-INR  BILIRUBIN, DIRECT  HEPATITIS PANEL, ACUTE  RAPID HIV SCREEN (HIV 1/2 AB+AG)    EKG None  Radiology No results found.   Ct Abdomen Pelvis W Contrast  Result Date: 09/10/2017 CLINICAL DATA:  Abnormal labs and jaundice EXAM: CT ABDOMEN AND PELVIS WITH CONTRAST TECHNIQUE: Multidetector CT imaging of the abdomen and pelvis was performed using the standard protocol following bolus administration of intravenous contrast. CONTRAST:  ISOVUE-300 IOPAMIDOL (ISOVUE-300) INJECTION 61%, <See Chart> ISOVUE-300 IOPAMIDOL (ISOVUE-300) INJECTION 61% COMPARISON:  Ultrasound 09/09/2017 FINDINGS: Lower chest: Lung bases demonstrate no acute consolidation or effusion. The heart size is within normal limits. Hepatobiliary: Contracted gallbladder with tiny stones near the neck. No significant biliary dilatation. Possible small stones in the region of the distal duct, these measure up  to 2 mm. Pancreas: Unremarkable. No pancreatic ductal dilatation or surrounding inflammatory changes. Spleen: Normal in size without focal abnormality. Adrenals/Urinary Tract: Adrenal glands are unremarkable. Subcentimeter hypodensity in the mid left kidney too small to further characterize. No hydronephrosis. Bladder is unremarkable. Stomach/Bowel: Stomach is within normal limits. Appendix not well seen but no right lower quadrant inflammatory process. No evidence of bowel wall thickening, distention, or inflammatory changes. Vascular/Lymphatic: Minimal aortic atherosclerosis. No aneurysm. No significant adenopathy. Reproductive: Prostate is unremarkable. Other: Negative for free air or free fluid. Fall fat in the inguinal canals. Musculoskeletal: Degenerative changes of the lumbar spine. No acute or suspicious abnormality. IMPRESSION: 1. Contracted gallbladder with small calcified stones near the neck. Possible small stones in the distal bile duct but no significant biliary dilatation. 2. No CT evidence for acute intra-abdominal or pelvic abnormality. Electronically Signed   By: Jasmine Pang M.D.   On: 09/10/2017 00:27   US Abdomen Limited Ruq  Result Date: 09/09/2017 CLINICAL DATA:  Abnormal LFTs. EXAM: ULTRASOUND ABDOMEN LIMITED RIGHT UPPER QUADRANT COMPARISON:  None. FINDINGS: Gallbladder: Only partially distended. No gallstones or wall thickening visualized. No sonographic Murphy sign noted by sonographer. Common bile duct: Diameter: 3 mm, normal. Liver: Diffusely increased and heterogeneous parenchyma, liver is difficult to penetrate. No visualized focal lesion. Portal vein is patent on color Doppler imaging with normal direction of blood flow towards the liver. IMPRESSION: 1. Heterogeneous increased hepatic echogenicity consistent with steatosis. 2. No biliary dilatation.  Gallbladder only partially distended. Electronically Signed   By: Rubye Oaks M.D.   On: 09/09/2017 21:57     Procedures Procedures (including critical care time)  Medications Ordered in ED Medications  cholestyramine (QUESTRAN) packet 4 g (has no administration in time range)  diphenhydrAMINE (BENADRYL) capsule 50 mg (50 mg Oral Given 09/09/17 2329)     Initial Impression / Assessment and Plan / ED Course  I have reviewed the triage vital signs and the nursing notes.  Pertinent labs & imaging results that were available during my care of the patient were reviewed by me and considered in my medical decision making (see chart for details).     56yM with painless jaundice. Afebrile. Recent bactrim usage and suspect this is etiology. Lipase elevated though. CT equivocal. He has no pain. No vomiting. Afebrile. No leukocytosis.  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  I doubt.  Is actually less anemic than he historically is.  I would not expect this to cause LFT derangement to this degree either.  Plan for monitoring of LFTs at this point. Symptomatic treatment of itching.  Case is discussed with gastroenterology.  Could very well be due to Bactrim.  He has stopped at this point.  May potentially take several weeks for LFTs to normalize.  Questionable CBD stone noted on CT scan.  There is no CBD dilation though.  He will need serial monitoring of his LFTs & GI follow-up.  Final Clinical Impressions(s) / ED Diagnoses   Final diagnoses:  Abnormal LFTs    ED Discharge Orders    None       Raeford RazorKohut, Ardit Danh, MD 09/15/17 1357

## 2017-09-09 NOTE — ED Triage Notes (Signed)
Pt has been taking Bactrim and reports having an allergic reaction with itching, eyes burning, jaundice, dark urine, loose, light colored stools and upset stomach with abnormal labs from doctors office. Denies pain. Pt takes chronic antibiotics for rt knee fusion infection suppression.

## 2017-09-09 NOTE — ED Notes (Signed)
Delay in lab draw,  Pt enroute to ultrasound

## 2017-09-10 ENCOUNTER — Emergency Department (HOSPITAL_COMMUNITY): Payer: Medicare Other

## 2017-09-10 DIAGNOSIS — R17 Unspecified jaundice: Secondary | ICD-10-CM | POA: Diagnosis not present

## 2017-09-10 LAB — RAPID HIV SCREEN (HIV 1/2 AB+AG)
HIV 1/2 Antibodies: NONREACTIVE
HIV-1 P24 Antigen - HIV24: NONREACTIVE

## 2017-09-10 MED ORDER — CHOLESTYRAMINE LIGHT 4 G PO PACK: 4.0000 g | PACK | Freq: Two times a day (BID) | ORAL | 0 refills | Status: DC | PRN

## 2017-09-10 MED ORDER — IOPAMIDOL (ISOVUE-300) INJECTION 61%: 100.0000 mL | Freq: Once | INTRAVENOUS | Status: DC | PRN

## 2017-09-10 NOTE — Discharge Instructions (Addendum)
I discussed with Dr Meridee ScoreMansouraty, gastroenterology. He suspects your symptoms are related to recent bactrim usage. The treatment is to stop the offending agent which you have already done. It should not give you long term problems but it may take several weeks for your LFTs to normalize, possibly over a month. If you do not develop additional symptoms, then your labs need to be serially monitored.   You may take an antihistamine like benadryl as needed for itching. You can also take cholestyramine as your daughter suggested. I'd take it twice a day for a couple days and then as needed after that.

## 2017-09-12 LAB — HEPATITIS PANEL, ACUTE
HCV Ab: <0.1 s/co ratio (ref 0.0–0.9)
Hep A IgM: NEGATIVE
Hep B C IgM: NEGATIVE
Hepatitis B Surface Ag: NEGATIVE

## 2017-09-13 ENCOUNTER — Other Ambulatory Visit: Payer: Self-pay | Admitting: Physician Assistant

## 2017-09-13 ENCOUNTER — Telehealth: Payer: Self-pay | Admitting: Emergency Medicine

## 2017-09-13 DIAGNOSIS — R17 Unspecified jaundice: Secondary | ICD-10-CM

## 2017-09-13 DIAGNOSIS — K7689 Other specified diseases of liver: Secondary | ICD-10-CM

## 2017-09-13 MED ORDER — CHOLESTYRAMINE LIGHT 4 G PO PACK: 4.0000 g | PACK | Freq: Two times a day (BID) | ORAL | 0 refills | Status: DC | PRN

## 2017-09-13 NOTE — Telephone Encounter (Signed)
Last OV 09/07/17, No future OV  Please see message. Please advise.

## 2017-09-13 NOTE — Telephone Encounter (Signed)
Refill sent.

## 2017-09-13 NOTE — Addendum Note (Signed)
Addended by: Con MemosMOORE, Lesbia Ottaway S on: 09/13/2017 04:23 PM   Modules accepted: Orders

## 2017-09-13 NOTE — Telephone Encounter (Signed)
Please advise of medication

## 2017-09-13 NOTE — Telephone Encounter (Signed)
Advised patient that lab orders was faxed to [facility]. He was worried and advised his ID provider to place the orders and fax them as well. So he is double ordered. He will call them to set up an appointment for labs

## 2017-09-13 NOTE — Telephone Encounter (Signed)
Copied from CRM [id number]. Topic: General - Other >> Sep 13, 2017 11:14 AM Angela NevinWilliams, Candice N wrote: Reason for CRM: Pt called stating that he had just spoken with someone from the office requesting a fax number for [facility] [place]. Pt did not remember who he spoke with. Pt is requesting his lab orders be faxed to [phone] ([facility]) >> Sep 13, 2017  2:09 PM Percival SpanishKennedy, Cheryl W wrote:  Pt called back to follow up on his request to have a order sent for him to have labs drawn. The reason he is wanting to have labs drawn is because he went to the ER and was [facility] which he did not and now he is asking to have labs drawn while he is in [place]. His discharge summary told him he needed to have repeated labs and I told him that this is where that follow up visit with his PCP would have let his Pcp know the reason why he needed to have repeated labs.  Pt is wanting to know why his Pcp can not just order his labs and I told him because he had not been seen to discuss this with his Pcp. Pt said he is going to be in [place] for a while  . .Marland Kitchen

## 2017-09-13 NOTE — Telephone Encounter (Signed)
Copied from CRM [id number]. Topic: General - Other >> Sep 13, 2017 11:14 AM Angela NevinWilliams, Candice N wrote: Reason for CRM: Pt called stating that he had just spoken with someone from the office requesting a fax number for [facility] [place]. Pt did not remember who he spoke with. Pt is requesting his lab orders be faxed to [phone] ([facility])

## 2017-09-13 NOTE — Telephone Encounter (Signed)
Orders faxed to number provided by patient

## 2017-09-13 NOTE — Telephone Encounter (Signed)
Copied from CRM [id number]. Topic: General - Other >> Sep 13, 2017 10:58 AM Leafy Roobinson, Norma J wrote: Reason for CRM pt is calling and needs refill on cholestyramine 4 gm packet. This medication was prescribed when [facility] was [facility] for [facility]. Pt needs new rx for cholestyramine 4 gm packet #[facility] in  [place] ,[place] phone number [phone]

## 2017-09-13 NOTE — Telephone Encounter (Signed)
Ok to fax orders for repeat CMP, CBC w diff. We discussed this at his appointment and I had given the ok.

## 2017-09-14 ENCOUNTER — Telehealth: Payer: Self-pay | Admitting: Physician Assistant

## 2017-09-14 DIAGNOSIS — K7689 Other specified diseases of liver: Secondary | ICD-10-CM

## 2017-09-14 NOTE — Telephone Encounter (Signed)
Received results from [facility] in [place] for patient. AST/ALT continue to improve. Alk Phos is staying steady but elevated and is his bilirubin. I would like for him to see a liver specialist just to make sure nothing further is needed currently. When is he coming back to [place]?

## 2017-09-14 NOTE — Telephone Encounter (Signed)
Advised patient of results. Concerned of elevated bilirubin level and alk phos. He is agreeable with referral to liver specialist.  He plans on being in [place] for about 2 weeks. He is packing his house that he is selling.  ID is prescribing his abx that is in the place of his vancomycin.

## 2017-09-14 NOTE — Telephone Encounter (Signed)
This encounter was created in error - please disregard.

## 2017-09-14 NOTE — Telephone Encounter (Signed)
PCP has been aware. Other labs result were faxed over. See other note of labs from [facility].

## 2017-09-14 NOTE — Telephone Encounter (Signed)
Incoming call related to critical lab value from patient.  From [facility] Lab: Kathlynn Grate( Barbara Curan)    Direct Bili: 4.00   Total Bili  4.8

## 2017-09-14 NOTE — Telephone Encounter (Signed)
Incoming call from [facility] [place]. Lab [facility]( Barbara Curan)          Total Bili 4.8           Direct Bili 4.00    Notified Patina @ [facility] [place]

## 2017-09-15 ENCOUNTER — Telehealth: Payer: Self-pay | Admitting: Emergency Medicine

## 2017-09-15 NOTE — Telephone Encounter (Signed)
Pt called to f/u on his US from today. He is asking if this can wait until next week to bring in to the office or does he need to go into town in [place] to fax to the office? Please notify pt.  Pt is scheduled with a liver specialist when he comes back into town (9/3 10:00am) [facility]. Pt states that he sees several doctors and is stressed out with so many "fingers in the pot" and wants [facility] to know that he is going by his direction. He would like the other providers to refer to [facility] to make recommendations. He's trying to deal with one person/provider if possible. He knows the liver specialist may be a bit different. He said ID called to have an US and he just completed the US today as recommended by [place].

## 2017-09-15 NOTE — Telephone Encounter (Signed)
Copied from CRM [id number]. Topic: General - Other >> Sep 15, 2017  2:09 PM Debroah LoopLander, Lumin L wrote: Reason for CRM: Dr. Marvell Fullerob Abeyta called for [facility] but disconnected while holding. He is caring [facility] the [facility] at [facility] [place] and said it was urgent. Unable to get contact info.

## 2017-09-15 NOTE — Telephone Encounter (Signed)
I found Dr. Leone Payorob Abeyta's contact info on [employer] and called him and he stated that they have discharged Mr. Satira SarkSt. Alla Germanierre and that he is coming to the [facility] and has all of his lab results, still elevated lipase, and stated the patient will bring all of the work done with him at his visit. Dr. Cleone Slimob just wanted you to be aware. His contact number is [phone], [facility].

## 2017-09-16 NOTE — Telephone Encounter (Signed)
Received records. Will review.

## 2017-09-20 ENCOUNTER — Other Ambulatory Visit: Payer: Self-pay | Admitting: Physician Assistant

## 2017-09-20 NOTE — Telephone Encounter (Signed)
Copied from CRM [id number]. Topic: Quick Communication - Rx Refill/Question >> Sep 20, 2017  8:38 AM Tamela Oddi, NT wrote: Medication:cholestyramine light (PREVALITE) 4 g packet  Has the patient contacted their pharmacy? No. (Agent: If no, request that the patient contact the pharmacy for the refill.) (Agent: If yes, when and what did the pharmacy advise?)  Preferred Pharmacy (with phone number or street name): [id number] - [place], [place] - [address] [phone] (Phone) [phone] (Fax)    Agent: Please be advised that RX refills may take up to 3 business days. We ask that you follow-up with your pharmacy.

## 2017-09-20 NOTE — Telephone Encounter (Signed)
Cholestyramine 4g packet refill Last Refill:09/13/17 # 10 pack/0 refill Last OV: 09/07/17 PCP: Daphine Deutscher Pharmacy: [facility] [phone] - [place], [place] - [address] [phone] (Phone) [phone] (Fax)

## 2017-09-21 MED ORDER — CHOLESTYRAMINE LIGHT 4 G PO PACK: 4.0000 g | PACK | Freq: Two times a day (BID) | ORAL | 0 refills | Status: AC | PRN

## 2017-09-21 NOTE — Telephone Encounter (Signed)
Last OV 09/07/17, No future OV  Last filled 09/13/17, 10 g packet with 0 refills  Continue?

## 2017-09-21 NOTE — Telephone Encounter (Signed)
Called patient and he stated that he saw the Hepatologist yesterday and has labs for her tomorrow and another appointment to go over labs on 10/04/17.  Patient stated that [facility] in [place] is currently out of the Prevalite packets and hope they get some in soon or if they could possibly [facility] a different [facility]. Patient will call us if he needs for [place] to send his prescription to a different [facility].

## 2017-10-20 ENCOUNTER — Encounter: Payer: Self-pay | Admitting: Physician Assistant

## 2017-10-21 ENCOUNTER — Encounter: Payer: Self-pay | Admitting: Physician Assistant

## 2017-10-25 ENCOUNTER — Ambulatory Visit (INDEPENDENT_AMBULATORY_CARE_PROVIDER_SITE_OTHER): Payer: Medicare Other | Admitting: Physician Assistant

## 2017-10-25 ENCOUNTER — Encounter: Payer: Self-pay | Admitting: Physician Assistant

## 2017-10-25 ENCOUNTER — Other Ambulatory Visit: Payer: Self-pay

## 2017-10-25 VITALS — BP 110/62 | HR 89 | Temp 98.3°F | Resp 14 | Ht 65.0 in | Wt 250.0 lb

## 2017-10-25 DIAGNOSIS — K719 Toxic liver disease, unspecified: Secondary | ICD-10-CM | POA: Diagnosis not present

## 2017-10-25 DIAGNOSIS — G4701 Insomnia due to medical condition: Secondary | ICD-10-CM | POA: Diagnosis not present

## 2017-10-25 LAB — COMPREHENSIVE METABOLIC PANEL
ALK PHOS: 191 U/L — AB (ref 39–117)
ALT: 54 U/L — AB (ref 0–53)
AST: 47 U/L — ABNORMAL HIGH (ref 0–37)
Albumin: 3.9 g/dL (ref 3.5–5.2)
BILIRUBIN TOTAL: 19.2 mg/dL — AB (ref 0.2–1.2)
BUN: 28 mg/dL — AB (ref 6–23)
CO2: 20 mEq/L (ref 19–32)
Calcium: 9.5 mg/dL (ref 8.4–10.5)
Chloride: 106 mEq/L (ref 96–112)
Creatinine, Ser: 1.76 mg/dL — ABNORMAL HIGH (ref 0.40–1.50)
GFR: 42.77 mL/min — ABNORMAL LOW (ref >60.00)
GLUCOSE: 117 mg/dL — AB (ref 70–99)
POTASSIUM: 4.1 meq/L (ref 3.5–5.1)
SODIUM: 138 meq/L (ref 135–145)
TOTAL PROTEIN: 6.5 g/dL (ref 6.0–8.3)

## 2017-10-25 LAB — PROTIME-INR
INR: 1.1 ratio — ABNORMAL HIGH (ref 0.8–1.0)
Prothrombin Time: 12.7 s (ref 9.6–13.1)

## 2017-10-25 MED ORDER — TRAZODONE HCL 50 MG PO TABS: 25.0000 mg | ORAL_TABLET | Freq: Every evening | ORAL | 3 refills | Status: DC | PRN

## 2017-10-25 NOTE — Patient Instructions (Signed)
Please go to the lab today for blood work.  I will call you with your results. We will alter treatment regimen(s) if indicated by your results.   Start the Trazodone at 1/2 tablet (25 mg) nightly. This is one of the safest first options for your sleep giving hepatic impairment.  Let me know how this works over the next few nights.   Please follow-up with specialists as scheduled. I will call you when I have gotten and reviewed all of the hospital records.

## 2017-10-25 NOTE — Progress Notes (Signed)
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 Patient endorses hewas seen last week at his Hepatologist's office. Bilirubin noted to be at 12.1 so he was sent to Hospital for liver biopsy and further assessment in [facility]). No records available initially for review. Requested from [facility] and awaiting fax. 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 CT negative for stone or other obstruction. Was told that his issues are still stemming from [facility] and this would take months to resolve. Patient currently on a regimen of cholestyramine and ursodiol to help with cholestasis and pruritus. Is still noting significant jaundice. Notes medications are helping significantly with pruritus. Notes stools have returned to normal color. Urine is still darker. He is trying to keep well-hydrated. Has follow-up early next week with his liver specialist. Patient does note significant issue falling and staying asleep due to a combination of the pruritus from [facility] and anxiety about his health. Was on hydroxyzine to help with this but was ineffective. Would like to discuss options.  Past Medical History:  Diagnosis Date  . Anxiety   . Arthritis    "knees" (07/19/2017)  . Cellulitis of right leg 06/2017  . Chronic lower back pain    "L5-S1; I've had injections"  . DVT (deep venous thrombosis) ([facility]) ~ 04/2017   RUE "when PICC was removed"  . Family history of adverse reaction to anesthesia    "mother has hard time coming out of it; gets PONV" (07/19/2017)  . GERD (gastroesophageal reflux disease)   . History of blood transfusion    "I believe I have had one; related to one of the knee ORs" (07/19/2017)    Current Outpatient Medications on File Prior to Visit    Medication Sig Dispense Refill  . busPIRone (BUSPAR) 7.5 MG tablet Take 2 tablets (15 mg total) by mouth 2 (two) times daily. 120 tablet 5  . cefpodoxime (VANTIN) 200 MG tablet Take 2 tablets by mouth 2 (two) times daily.  3  . cholestyramine light (PREVALITE) 4 g packet Take 1 packet (4 g total) by mouth 2 (two) times daily as needed (itching). 30 packet 0  . EPINEPHrine (EPIPEN 2-PAK) 0.3 mg/0.3 mL IJ SOAJ injection Inject 1 pen into the muscle daily as needed for anaphylaxis.    Marland Kitchen gabapentin (NEURONTIN) 300 MG capsule Take 1 capsule (300 mg total) by mouth 3 (three) times daily. 90 capsule 3  . ibuprofen (ADVIL,MOTRIN) 200 MG tablet Take 200 mg by mouth every 6 (six) hours as needed.    Marland Kitchen oxyCODONE (OXY IR/ROXICODONE) 5 MG immediate release tablet Take 5-10 mg by mouth every 4 (four) hours as needed for pain.  0  . pantoprazole (PROTONIX) 40 MG tablet Take 40 mg by mouth daily.  3  . ursodiol (ACTIGALL) 500 MG tablet Take 500 mg by mouth 3 (three) times daily.   1  . traMADol (ULTRAM) 50 MG tablet Take 1 tablet by mouth every 12 (twelve) hours as needed for moderate pain.      No current facility-administered medications on file prior to visit.     Allergies  Allergen Reactions  . Bactrim [Sulfamethoxazole-Trimethoprim] Itching, Nausea And Vomiting and Other (See Comments)    Possible biliary blockage causing Tbili and LFTs to elevate  . Levofloxacin  Anaphylaxis    Per Care Everywhere  . Tape Rash    Use paper tape and if a picc line is needed use the kind without the white border  . Amoxicillin Swelling    Has patient had a PCN reaction causing immediate rash, facial/tongue/throat swelling, SOB or lightheadedness with hypotension: Yes Has patient had a PCN reaction causing severe rash involving mucus membranes or skin necrosis: No Has patient had a PCN reaction that required hospitalization: No Has patient had a PCN reaction occurring within the last 10 years: No' If all of the  above answers are "NO", then may proceed with Cephalosporin use.     History reviewed. No pertinent family history.  Social History   Socioeconomic History  . Marital status: Married    Spouse name: Not on file  . Number of children: Not on file  . Years of education: Not on file  . Highest education level: Not on file  Occupational History  . Not on file  Social Needs  . Financial resource strain: Not on file  . Food insecurity:    Worry: Not on file    Inability: Not on file  . Transportation needs:    Medical: Not on file    Non-medical: Not on file  Tobacco Use  . Smoking status: Former Smoker    Packs/day: 1.00    Years: 20.00    Pack years: 20.00    Types: Cigarettes    Last attempt to quit: 1998    Years since quitting: 21.7  . Smokeless tobacco: Never Used  Substance and Sexual Activity  . Alcohol use: Not Currently  . Drug use: Never  . Sexual activity: Not Currently  Lifestyle  . Physical activity:    Days per week: Not on file    Minutes per session: Not on file  . Stress: Not on file  Relationships  . Social connections:    Talks on phone: Not on file    Gets together: Not on file    Attends religious service: Not on file    Active member of club or organization: Not on file    Attends meetings of clubs or organizations: Not on file    Relationship status: Not on file  Other Topics Concern  . Not on file  Social History Narrative  . Not on file   Review of Systems - See HPI.  All other ROS are negative.  BP 110/62   Pulse 89   Temp 98.3 F (36.8 C) (Oral)   Resp 14   Ht 5' 5"  (1.651 m)   Wt 250 lb (113.4 kg)   SpO2 98%   BMI 41.60 kg/m   Physical Exam  Constitutional: He is oriented to person, place, and time. He appears well-developed and well-nourished.  HENT:  Head: Normocephalic and atraumatic.  Right Ear: External ear normal.  Left Ear: External ear normal.  Nose: Nose normal.  Mouth/Throat: Oropharynx is clear and moist.   Eyes: Conjunctivae are normal. Scleral icterus is present.  Neck: Neck supple.  Cardiovascular: Normal rate, regular rhythm, normal heart sounds and intact distal pulses.  Pulmonary/Chest: Effort normal and breath sounds normal. No stridor. No respiratory distress. He has no wheezes. He has no rales.  Abdominal: Soft. Bowel sounds are normal. He exhibits no distension. There is no tenderness.  Lymphadenopathy:    He has no cervical adenopathy.  Neurological: He is alert and oriented to person, place, and time.  Skin: Capillary refill takes less than  2 seconds. He is not diaphoretic. No pallor.  Significant ongoing jaundice from drug-induced hepatic injury  Vitals reviewed.   Recent Results (from the past 2160 hour(s))  Comp Met (CMET)     Status: Abnormal   Collection Time: 09/07/17  8:43 AM  Result Value Ref Range   Sodium 134 (L) 135 - 145 mEq/L   Potassium 3.7 3.5 - 5.1 mEq/L   Chloride 102 96 - 112 mEq/L   CO2 20 19 - 32 mEq/L   Glucose, Bld 116 (H) 70 - 99 mg/dL   BUN 18 6 - 23 mg/dL   Creatinine, Ser 1.05 0.40 - 1.50 mg/dL   Total Bilirubin 3.6 (H) 0.2 - 1.2 mg/dL   Alkaline Phosphatase 215 (H) 39 - 117 U/L   AST 74 (H) 0 - 37 U/L   ALT 285 (H) 0 - 53 U/L   Total Protein 7.3 6.0 - 8.3 g/dL   Albumin 4.2 3.5 - 5.2 g/dL   Calcium 9.3 8.4 - 10.5 mg/dL   GFR 77.67 >60.00 mL/min  POCT urinalysis dipstick     Status: Abnormal   Collection Time: 09/07/17  8:49 AM  Result Value Ref Range   Color, UA amber    Clarity, UA clear    Glucose, UA Negative Negative   Bilirubin, UA 3+    Ketones, UA negative    Spec Grav, UA 1.015 1.010 - 1.025   Blood, UA negative    pH, UA 5.0 5.0 - 8.0   Protein, UA Positive (A) Negative   Urobilinogen, UA 0.2 0.2 or 1.0 E.U./dL   Nitrite, UA negative    Leukocytes, UA Negative Negative   Appearance     Odor    CBC with Differential/Platelet     Status: Abnormal   Collection Time: 09/08/17  1:18 PM  Result Value Ref Range   WBC 5.5 4.0  - 10.5 K/uL   RBC 4.70 4.22 - 5.81 Mil/uL   Hemoglobin 11.6 (L) 13.0 - 17.0 g/dL   HCT 35.4 (L) 39.0 - 52.0 %   MCV 75.3 (L) 78.0 - 100.0 fl   MCHC 32.8 30.0 - 36.0 g/dL   RDW 19.5 (H) 11.5 - 15.5 %   Platelets 318.0 150.0 - 400.0 K/uL   Neutrophils Relative % 58.7 43.0 - 77.0 %   Lymphocytes Relative 27.0 12.0 - 46.0 %   Monocytes Relative 9.3 3.0 - 12.0 %   Eosinophils Relative 4.0 0.0 - 5.0 %   Basophils Relative 1.0 0.0 - 3.0 %   Neutro Abs 3.3 1.4 - 7.7 K/uL   Lymphs Abs 1.5 0.7 - 4.0 K/uL   Monocytes Absolute 0.5 0.1 - 1.0 K/uL   Eosinophils Absolute 0.2 0.0 - 0.7 K/uL   Basophils Absolute 0.1 0.0 - 0.1 K/uL  Comprehensive metabolic panel     Status: Abnormal   Collection Time: 09/09/17  5:09 PM  Result Value Ref Range   Sodium 138 135 - 145 mmol/L   Potassium 3.4 (L) 3.5 - 5.1 mmol/L   Chloride 106 98 - 111 mmol/L   CO2 22 22 - 32 mmol/L   Glucose, Bld 158 (H) 70 - 99 mg/dL   BUN 14 6 - 20 mg/dL   Creatinine, Ser 1.17 0.61 - 1.24 mg/dL   Calcium 9.1 8.9 - 10.3 mg/dL   Total Protein 7.1 6.5 - 8.1 g/dL   Albumin 3.6 3.5 - 5.0 g/dL   AST 51 (H) 15 - 41 U/L   ALT 178 (H) 0 -  44 U/L   Alkaline Phosphatase 188 (H) 38 - 126 U/L   Total Bilirubin 4.6 (H) 0.3 - 1.2 mg/dL   GFR calc non Af Amer >60 >60 mL/min   GFR calc Af Amer >60 >60 mL/min    Comment: (NOTE) The eGFR has been calculated using the CKD EPI equation. This calculation has not been validated in all clinical situations. eGFR's persistently <60 mL/min signify possible Chronic Kidney Disease.    Anion gap 10 5 - 15    Comment: Performed at [facility] [address] [place], [place] [address]  CBC with Differential     Status: Abnormal   Collection Time: 09/09/17  5:09 PM  Result Value Ref Range   WBC 4.7 4.0 - 10.5 K/uL   RBC 4.73 4.22 - 5.81 MIL/uL   Hemoglobin 11.5 (L) 13.0 - 17.0 g/dL   HCT 37.1 (L) 39.0 - 52.0 %   MCV 78.4 78.0 - 100.0 fL   MCH 24.3 (L) 26.0 - 34.0 pg   MCHC 31.0 30.0 -  36.0 g/dL   RDW 17.8 (H) 11.5 - 15.5 %   Platelets 324 150 - 400 K/uL   Neutrophils Relative % 53 %   Neutro Abs 2.5 1.7 - 7.7 K/uL   Lymphocytes Relative 29 %   Lymphs Abs 1.4 0.7 - 4.0 K/uL   Monocytes Relative 11 %   Monocytes Absolute 0.5 0.1 - 1.0 K/uL   Eosinophils Relative 5 %   Eosinophils Absolute 0.2 0.0 - 0.7 K/uL   Basophils Relative 1 %   Basophils Absolute 0.1 0.0 - 0.1 K/uL   Immature Granulocytes 1 %   Abs Immature Granulocytes 0.1 0.0 - 0.1 K/uL    Comment: Performed at [facility] [address], [place], [place] [address]  Lipase, blood     Status: Abnormal   Collection Time: 09/09/17  5:09 PM  Result Value Ref Range   Lipase 594 (H) 11 - 51 U/L    Comment: RESULTS CONFIRMED BY MANUAL DILUTION Performed at [facility], [place] [address] [place], [place] [address]   Urinalysis, Routine w reflex microscopic     Status: Abnormal   Collection Time: 09/09/17  5:10 PM  Result Value Ref Range   Color, Urine AMBER (A) YELLOW    Comment: BIOCHEMICALS MAY BE AFFECTED BY COLOR   APPearance CLEAR CLEAR   Specific Gravity, Urine 1.021 1.005 - 1.030   pH 5.0 5.0 - 8.0   Glucose, UA NEGATIVE NEGATIVE mg/dL   Hgb urine dipstick SMALL (A) NEGATIVE   Bilirubin Urine MODERATE (A) NEGATIVE   Ketones, ur NEGATIVE NEGATIVE mg/dL   Protein, ur 30 (A) NEGATIVE mg/dL   Nitrite NEGATIVE NEGATIVE   Leukocytes, UA NEGATIVE NEGATIVE   RBC / HPF 0-5 0 - 5 RBC/hpf   WBC, UA 0-5 0 - 5 WBC/hpf   Bacteria, UA NONE SEEN NONE SEEN   Squamous Epithelial / LPF 0-5 0 - 5   Mucus PRESENT     Comment: Performed at [facility], [place] [address], [place], [place] [address]  Protime-INR     Status: None   Collection Time: 09/09/17 10:17 PM  Result Value Ref Range   Prothrombin Time 13.3 11.4 - 15.2 seconds   INR 1.02     Comment: Performed at [facility] [address] [place], [place] [address]  Bilirubin, direct     Status: Abnormal   Collection Time: 09/09/17  10:17 PM  Result Value Ref Range   Bilirubin, Direct 2.6 (H) 0.0 - 0.2 mg/dL    Comment: Performed at [facility] [address] [place], [place] [address]  Hepatitis panel, acute     Status: None   Collection Time: 09/09/17 10:17 PM  Result Value Ref Range   Hepatitis B Surface Ag Negative Negative   HCV Ab <0.1 0.0 - 0.9 s/co ratio    Comment: (NOTE)                                  Negative:     < 0.8                             Indeterminate: 0.8 - 0.9                                  Positive:     > 0.9 The CDC recommends that a positive HCV antibody result be followed up with a HCV Nucleic Acid Amplification test (871959). Performed At: [facility] [place], [place] [id number] Rush Farmer MD TA:[id number]    Hep A IgM Negative Negative   Hep B C IgM Negative Negative  Rapid HIV screen (HIV 1/2 Ab+Ag)     Status: None   Collection Time: 09/09/17 10:17 PM  Result Value Ref Range   HIV-1 P24 Antigen - HIV24 NON REACTIVE NON REACTIVE   HIV 1/2 Antibodies NON REACTIVE NON REACTIVE   Interpretation (HIV Ag Ab)      A non reactive test result means that HIV 1 or HIV 2 antibodies and HIV 1 p24 antigen were not detected in the specimen.    Comment: Performed at [facility], [place] [address] [place], [place] [address]   Assessment/Plan: 1. Drug induced liver disease Records received and reviewed. Will abstract into EMR. US revealed concern for biliary duct dilatation but CT negative for obstruction. Biopsy results not included. Will request again.  Avoid hepatotoxic and nephrotoxic agents. Repeat CMP and PT today. Will forward to Hepatology. Continue care directed by specialist.  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 Trying to avoid benzos due to liver function.    Leeanne Rio, PA-C

## 2017-10-26 ENCOUNTER — Telehealth: Payer: Self-pay | Admitting: Emergency Medicine

## 2017-10-26 NOTE — Telephone Encounter (Signed)
Patient advised of lab results. Should receive a phone call from liver specialist on the next steps. Continue with recheck of labs thru [facility].  Copied from CRM [id number]. Topic: Quick Communication - Lab Results >> Oct 26, 2017 12:09 PM Crist Infante wrote: Pt calling to go over the results of his labs, as he has questions on what to do next.

## 2017-10-27 ENCOUNTER — Telehealth: Payer: Self-pay | Admitting: Physician Assistant

## 2017-10-27 NOTE — Telephone Encounter (Signed)
Copied from CRM [id number]. Topic: Quick Communication - Rx Refill/Question >> Oct 27, 2017  3:13 PM Randy Figueroa B wrote: Medication: traZODone (DESYREL) 50 MG tablet [[id number]]  Pt called b/c the current dosage is not working for him to get better sleep; pt is inquiring if going up in dosage is better or recommended

## 2017-10-28 ENCOUNTER — Other Ambulatory Visit: Payer: Self-pay | Admitting: Emergency Medicine

## 2017-10-28 DIAGNOSIS — G4701 Insomnia due to medical condition: Secondary | ICD-10-CM

## 2017-10-28 MED ORDER — TRAZODONE HCL 50 MG PO TABS: ORAL_TABLET | ORAL | 3 refills | Status: AC

## 2017-10-28 NOTE — Telephone Encounter (Signed)
He can try 2 tabs (100mg ) daily if 1 tab is not effective.  Should not increase more than that

## 2017-10-28 NOTE — Telephone Encounter (Signed)
Please advise of dosage to help with improving sleep in PCP absence

## 2017-10-28 NOTE — Telephone Encounter (Signed)
Advised patient to increase the Trazodone up to 2 tablets. Titrate up as tolerate for sleep. Patient is worried about medication being processed thru the liver.

## 2017-10-31 NOTE — Telephone Encounter (Signed)
At this time, we would need any other sleep medications to be cleared by his liver specialist.  All of the 'stronger' sleep medications are processed through the liver and I would not be comfortable prescribing at this time

## 2017-10-31 NOTE — Telephone Encounter (Signed)
Pt called back stating that even after taking the 2 tablets, he is still having difficulty sleeping. Pt would like to know if there was a stronger medication that he could be prescribed.

## 2017-11-01 NOTE — Telephone Encounter (Signed)
Spoke with patient about needing to get advise from the liver specialist about which sleep medication will work for him since they all are processed thru the liver. He gave the numbers to contact the liver specialist on recommendations. Will contact liver specialist office for recommendations

## 2017-11-02 NOTE — Telephone Encounter (Signed)
Left a message with Randy Figueroa office for recommendations for stronger sleep medication. His current Trazodone 50 mg 2 tab QHS is not helping with sleep. Should receive a call back for recommendations

## 2017-11-04 ENCOUNTER — Encounter: Payer: Self-pay | Admitting: Physician Assistant

## 2017-11-05 ENCOUNTER — Encounter: Payer: Self-pay | Admitting: Physician Assistant

## 2017-11-14 ENCOUNTER — Encounter: Payer: Self-pay | Admitting: Family Medicine

## 2017-11-14 ENCOUNTER — Ambulatory Visit (INDEPENDENT_AMBULATORY_CARE_PROVIDER_SITE_OTHER): Payer: Medicare Other | Admitting: Family Medicine

## 2017-11-14 ENCOUNTER — Other Ambulatory Visit: Payer: Self-pay

## 2017-11-14 VITALS — BP 130/84 | HR 86 | Temp 98.3°F | Ht 65.0 in | Wt 240.6 lb

## 2017-11-14 DIAGNOSIS — K719 Toxic liver disease, unspecified: Secondary | ICD-10-CM | POA: Diagnosis not present

## 2017-11-14 DIAGNOSIS — T8450XD Infection and inflammatory reaction due to unspecified internal joint prosthesis, subsequent encounter: Secondary | ICD-10-CM | POA: Diagnosis not present

## 2017-11-14 DIAGNOSIS — K7589 Other specified inflammatory liver diseases: Secondary | ICD-10-CM | POA: Diagnosis not present

## 2017-11-14 NOTE — Patient Instructions (Signed)
Follow up with your specialists as scheduled.

## 2017-11-14 NOTE — Progress Notes (Signed)
Subjective  CC:  Chief Complaint  Patient presents with  . Hospitalization Follow-up    discharged on 11/05/2017, [facility] in [place]    HPI: Randy Figueroa is a 57 y.o. male who presents to the office today to address the problems listed above in the chief complaint.  57 year old male patient undergoing evaluation for persistent liver disease and infected prosthetic knee joint.  His PCP was not available today.  His schedule here for a hospital follow-up.  Discharge summary is not currently available.  I reviewed his chart extensively.  By his report, he was evaluated by his liver specialist on October 15.  Liver testing was reportedly worsened and he was called to be admitted to the hospital.  There they ran multiple tests although I do not have the records.  He did not find any new problems by his report.  He has follow-up tomorrow for hospital follow-up with his liver specialist.  He continues on antibiotics for his knee joint infection.  He feels stable: More symptoms are jaundice and pruritus. Assessment  1. Cholestatic hepatitis   2. 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 Drug induced liver disease      Plan   Liver disease: Request hospitalization records and labs.  Follow-up with liver specialist tomorrow.  Difficult case.  Knee infection per ID.  Continue antibiotics.  No new needs identified today.  Patient reports he will be traveling to [facility] over the holidays.  He may need refills.  I recommend he let [place] know prior to his traveling if possible.  However we will be able to send prescription renewals to his home pharmacy if needed.  Follow up: Return if symptoms worsen or fail to improve.   No orders of the defined types were placed in this encounter.  No orders of the defined types were placed in this encounter.     I reviewed the patients updated PMH, FH, and SocHx.    Patient  Active Problem List   Diagnosis Date Noted  . 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 Morbid obesity with BMI of 45.0-49.9, adult (HCC) 03/07/2017  . Medical marijuana use 03/07/2017  . 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 Lumbar spondylosis 02/17/2017  . Chronic low back pain 11/12/2015  . 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 Vitamin D deficiency 01/30/2015  . 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 Osteoarthritis 01/30/2015  . Hypercholesterolemia 01/30/2015  . Gastroesophageal reflux disease without esophagitis 01/30/2015  . Elevated blood-pressure reading without diagnosis of hypertension 01/30/2015  . Anxiety 01/30/2015  . Infection or inflammatory reaction due to internal joint prosthesis (HCC) 10/17/2013  . Insomnia 09/13/2013  . Psoriasis 03/12/2013  . Ankle pain 08/30/2011   Current Meds  Medication Sig  . busPIRone (BUSPAR) 7.5 MG tablet Take 2 tablets (15 mg total) by mouth 2 (two) times daily.  . cefpodoxime (VANTIN) 200 MG tablet Take 2 tablets by mouth 2 (two) times daily.  . cholestyramine light (PREVALITE) 4 g packet Take 1 packet (4 g total) by mouth 2 (two) times daily as needed (itching).  . EPINEPHrine (EPIPEN 2-PAK) 0.3 mg/0.3 mL IJ SOAJ injection Inject 1 pen into the muscle daily as needed for anaphylaxis.  Marland Kitchen gabapentin (NEURONTIN) 300 MG capsule Take 1 capsule (300 mg total) by mouth 3 (three) times daily.  . Melatonin  5 MG CAPS TK 1 T PO QHS PRN FOR INSOMNIA  . oxyCODONE (OXY IR/ROXICODONE) 5 MG immediate release tablet Take 5-10 mg by mouth every 4 (four) hours as needed for pain.  . pantoprazole (PROTONIX) 40 MG tablet Take 40 mg by mouth daily.  . traMADol (ULTRAM) 50 MG tablet Take 1 tablet by mouth every 12 (twelve) hours as needed for moderate pain.   . traZODone (DESYREL) 50 MG tablet Take 1-2 tablets by mouth at bedtime for sleep  .  ursodiol (ACTIGALL) 500 MG tablet Take 500 mg by mouth 3 (three) times daily.   . [DISCONTINUED] ibuprofen (ADVIL,MOTRIN) 200 MG tablet Take 200 mg by mouth every 6 (six) hours as needed.    Allergies: Patient is allergic to bactrim [sulfamethoxazole-trimethoprim]; levofloxacin; tape; and amoxicillin. Family History: Patient family history is not on file. Social History:  Patient  reports that he quit smoking about 21 years ago. His smoking use included cigarettes. He has a 20.00 pack-year smoking history. He has never used smokeless tobacco. He reports that he drank alcohol. He reports that he does not use drugs.  Review of Systems: Constitutional: Negative for fever malaise or anorexia Cardiovascular: negative for chest pain Respiratory: negative for SOB or persistent cough Gastrointestinal: negative for abdominal pain  Objective  Vitals: BP 130/84   Pulse 86   Temp 98.3 F (36.8 C)   Ht 5\' 5"  (1.651 m)   Wt 240 lb 9.6 oz (109.1 kg)   SpO2 98%   BMI 40.04 kg/m  General: no acute distress , A&Ox3, jaundice HEENT: PEERL, conjunctiva icteric bilaterally, Oropharynx moist,neck is supple Cardiovascular:  RRR without murmur or gallop.  Respiratory:  Good breath sounds bilaterally, CTAB with normal respiratory effort Gastrointestinal: soft, flat abdomen, normal active bowel sounds, no palpable masses, no hepatosplenomegaly, no appreciated hernias No ascites Skin:  Warm, no rashes No peripheral edema     Commons side effects, risks, benefits, and alternatives for medications and treatment plan prescribed today were discussed, and the patient expressed understanding of the given instructions. Patient is instructed to call or message via MyChart if he/she has any questions or concerns regarding our treatment plan. No barriers to understanding were identified. We discussed Red Flag symptoms and signs in detail. Patient expressed understanding regarding what to do in case of urgent or  emergency type symptoms.   Medication list was reconciled, printed and provided to the patient in AVS. Patient instructions and summary information was reviewed with the patient as documented in the AVS. This note was prepared with assistance of Dragon voice recognition software. Occasional wrong-word or sound-a-like substitutions may have occurred due to the inherent limitations of voice recognition software

## 2017-12-06 ENCOUNTER — Encounter: Payer: Self-pay | Admitting: Physician Assistant

## 2017-12-13 ENCOUNTER — Telehealth: Payer: Self-pay | Admitting: Emergency Medicine

## 2017-12-13 NOTE — Telephone Encounter (Signed)
[facility] patient total bilirubin is 9.4.  The lab contacted PCP office due to unable to contact Liver specialist off.  PCP is aware and labs were [facility].

## 2017-12-20 ENCOUNTER — Encounter: Payer: Self-pay | Admitting: Physician Assistant

## 2019-03-29 IMAGING — US US EXTREM LOW*R* LIMITED
1 series · 14 of 25 positions shown · non-contrast
Comparison: 07/05/2017.

CLINICAL DATA: Fluid collection around right knee. Multiple prior
surgeries.

EXAM:
ULTRASOUND RIGHT LOWER EXTREMITY LIMITED
TECHNIQUE: Ultrasound examination of the lower extremity soft tissues was
performed in the area of clinical concern.

[Series 1: us extrem low*right* limited · 0.09mm/px · 14 of 26 slices shown]
[im 1/26]
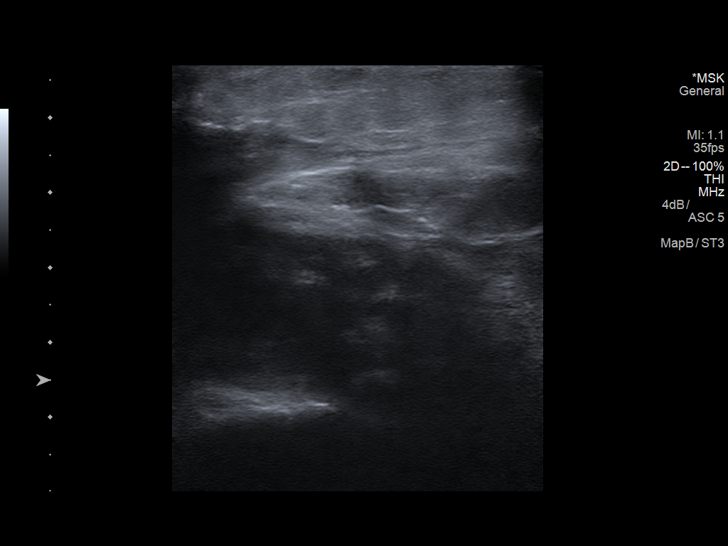
[im 3/26]
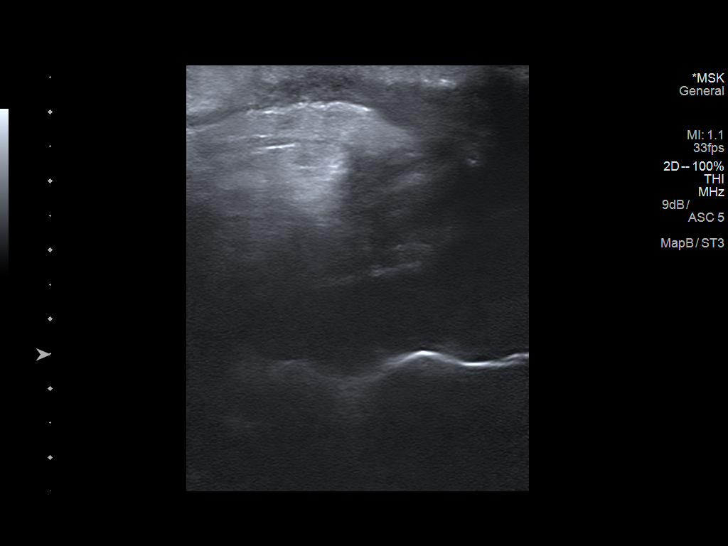
[im 5/26]
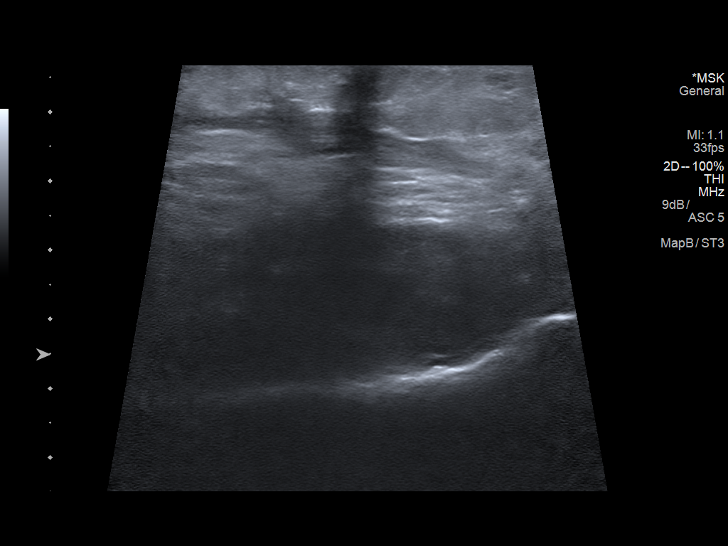
[im 7/26]
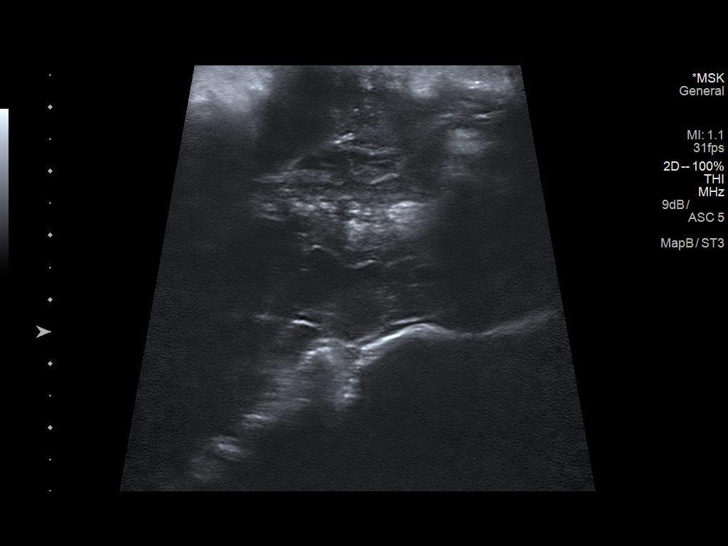
[im 9/26]
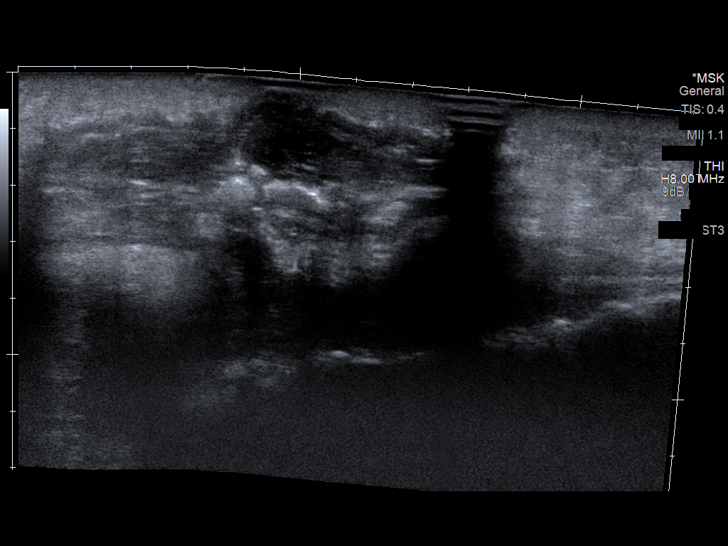
[im 10/26]
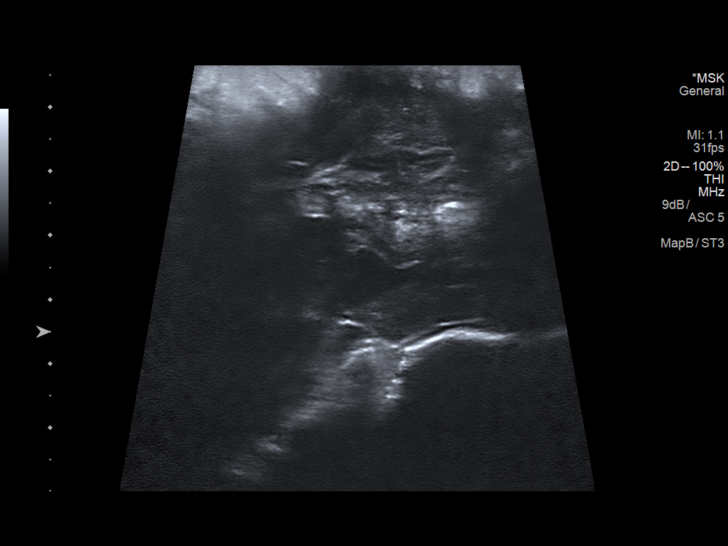
[im 12/26]
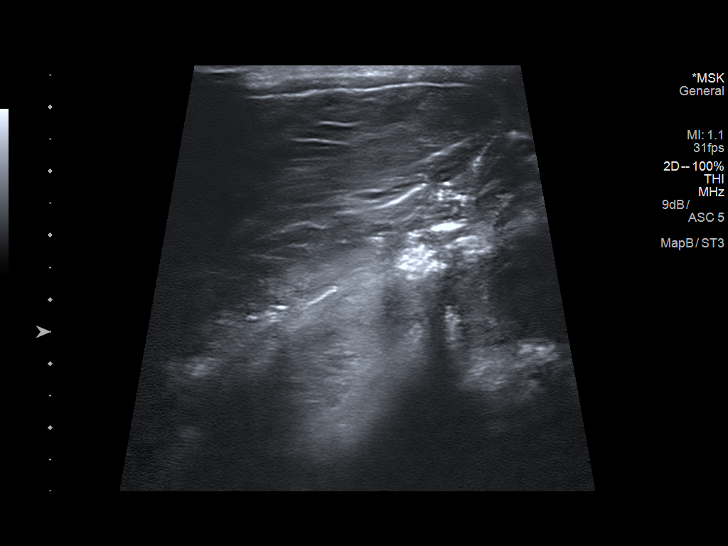
[im 14/26]
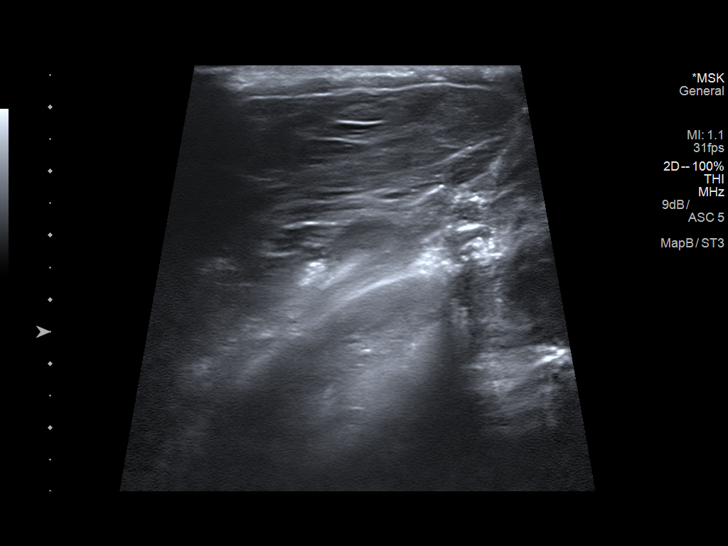
[im 16/26]
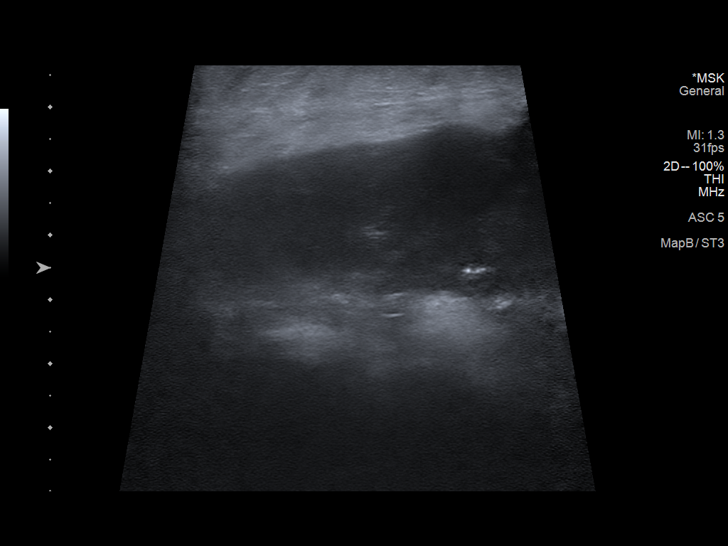
[im 17/26]
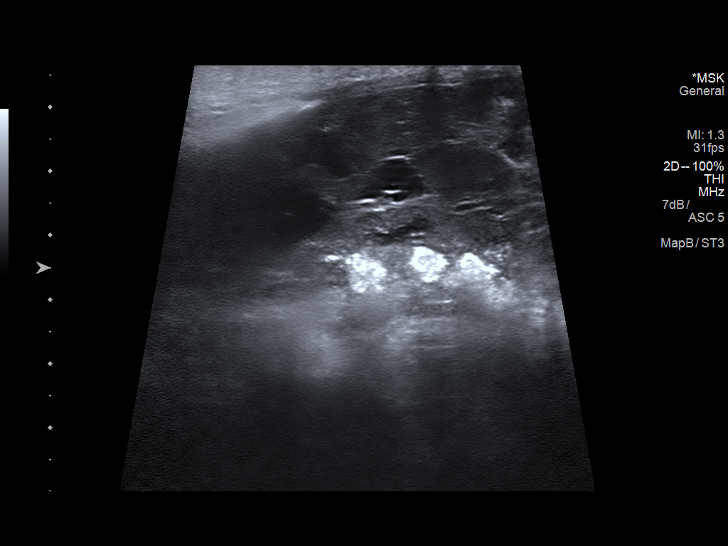
[im 19/26]
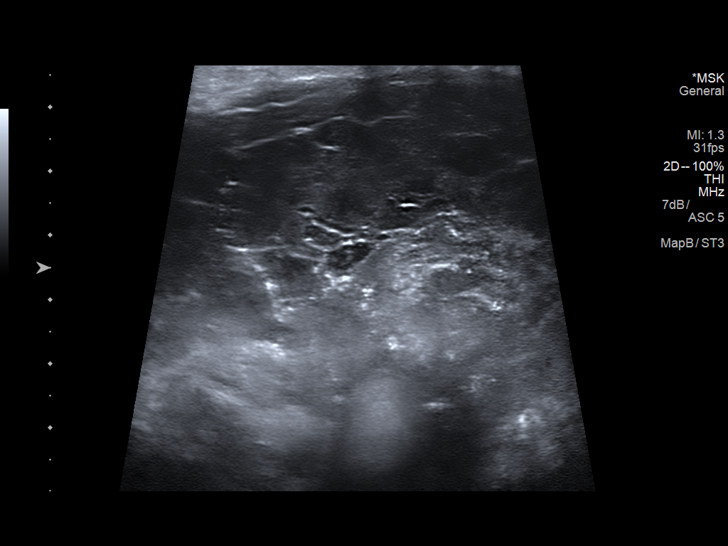
[im 21/26]
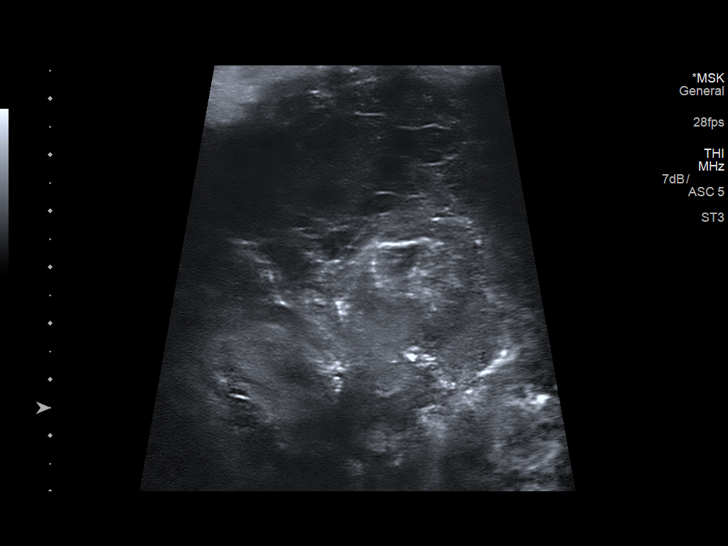
[im 23/26]
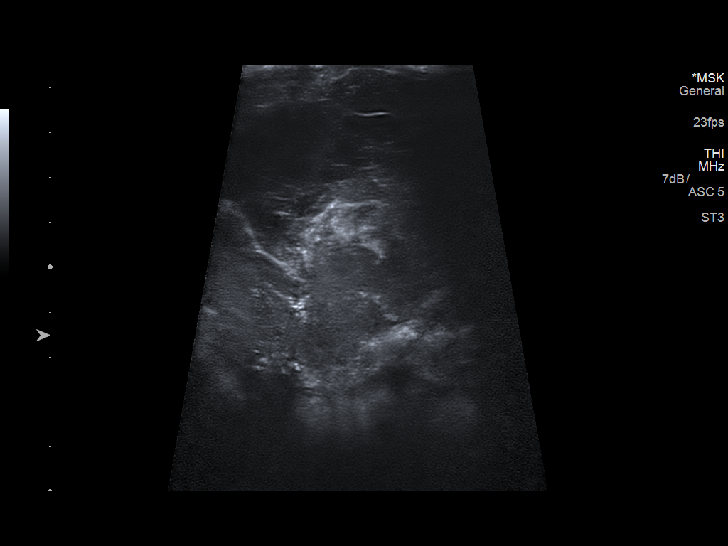
[im 26/26]
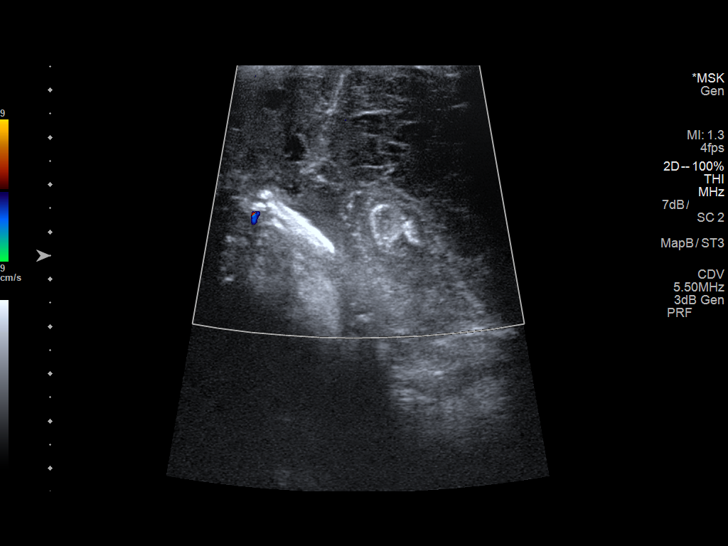

[14 of 25 positions shown; findings below may reference images not displayed]

FINDINGS: Laterally there is a 12.4 x 5.6 x 7.4 cm complex fluid collection
versus solid process is noted. Associated calcific densities are
present. Medially there is a 9.5 x 4.6 x 6.7 cm.
IMPRESSION: 1. Laterally there is a 12.4 x 5.6 x 7.4 cm complex fluid collection
versus solid process. Associated calcific densities are present.

2.  Medially there is a 9.5 x 4.6 x 6.7 cm fluid collection.

## 2019-10-09 IMAGING — CR DG FEMUR 2+V*R*
4 series · 4 of 4 positions shown · non-contrast
Comparison: None.

CLINICAL DATA: 56-year-old male status post surgery to the right
lower extremity 4 months ago. Right knee infection x1 month.

EXAM:
RIGHT FEMUR 2 VIEWS

[femur ap (1 of 2)]
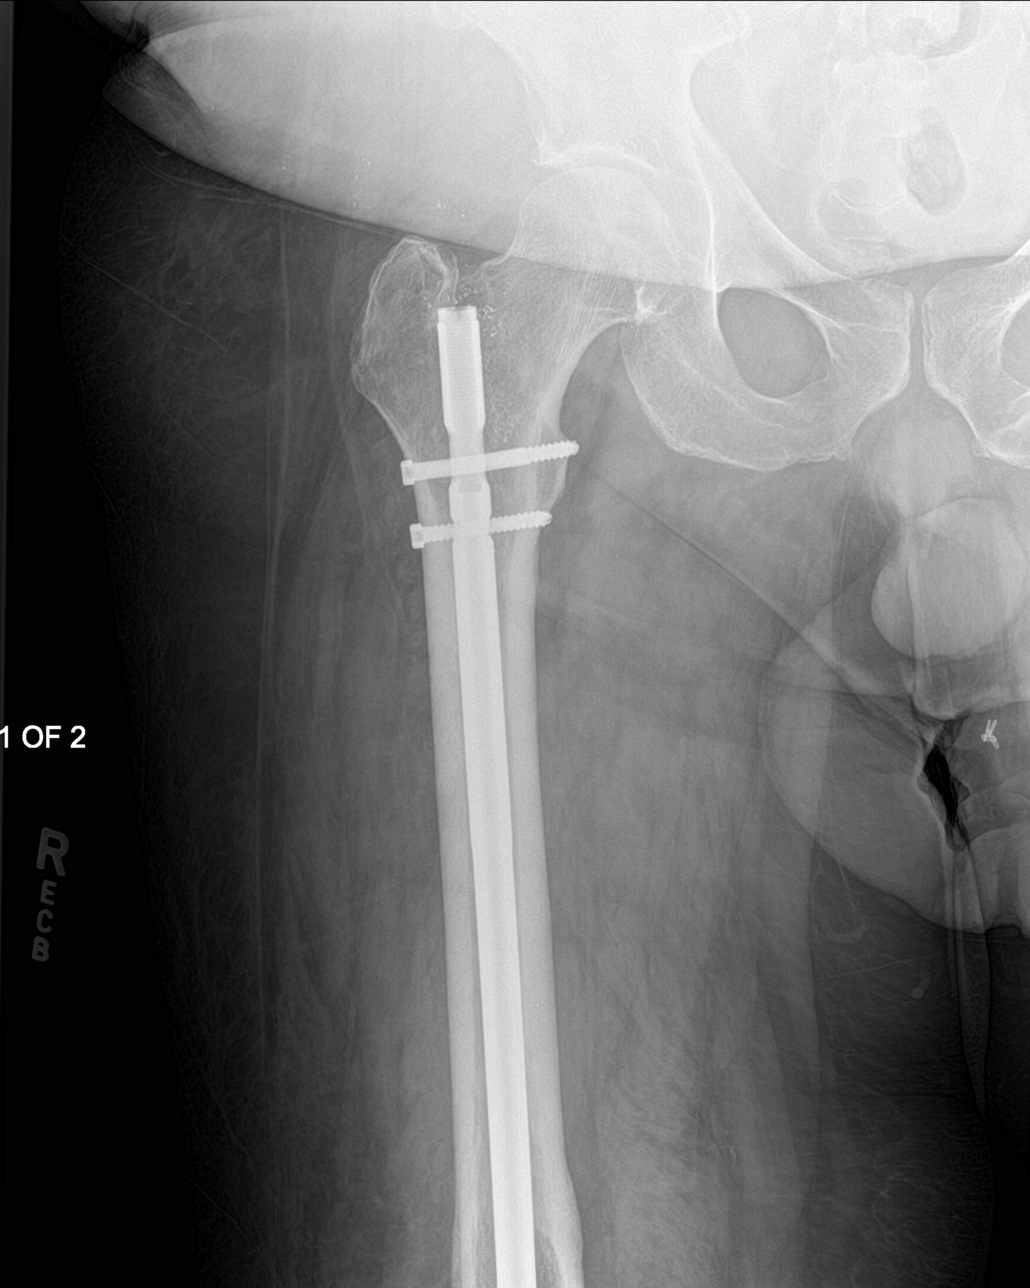

[femur ap (2 of 2)]
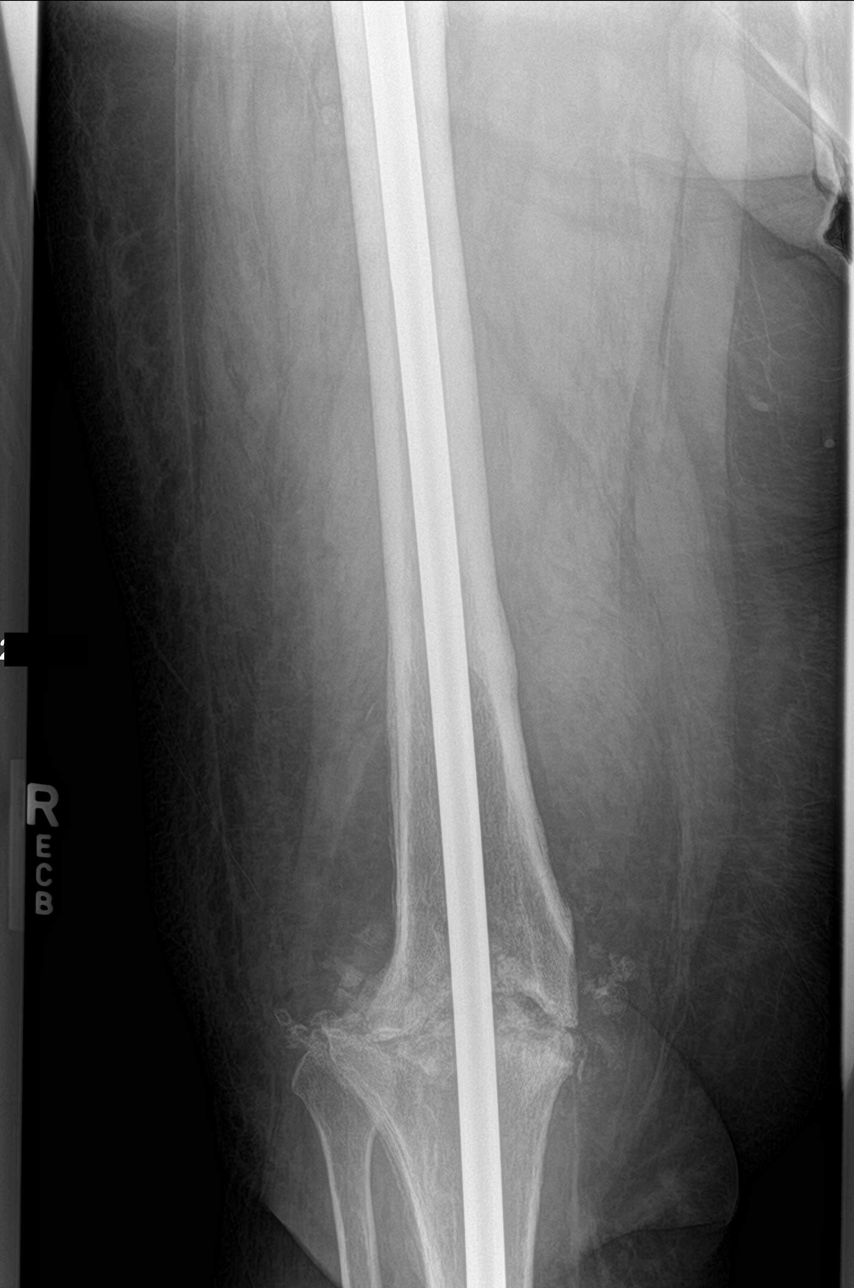

[femur lat (1 of 2)]
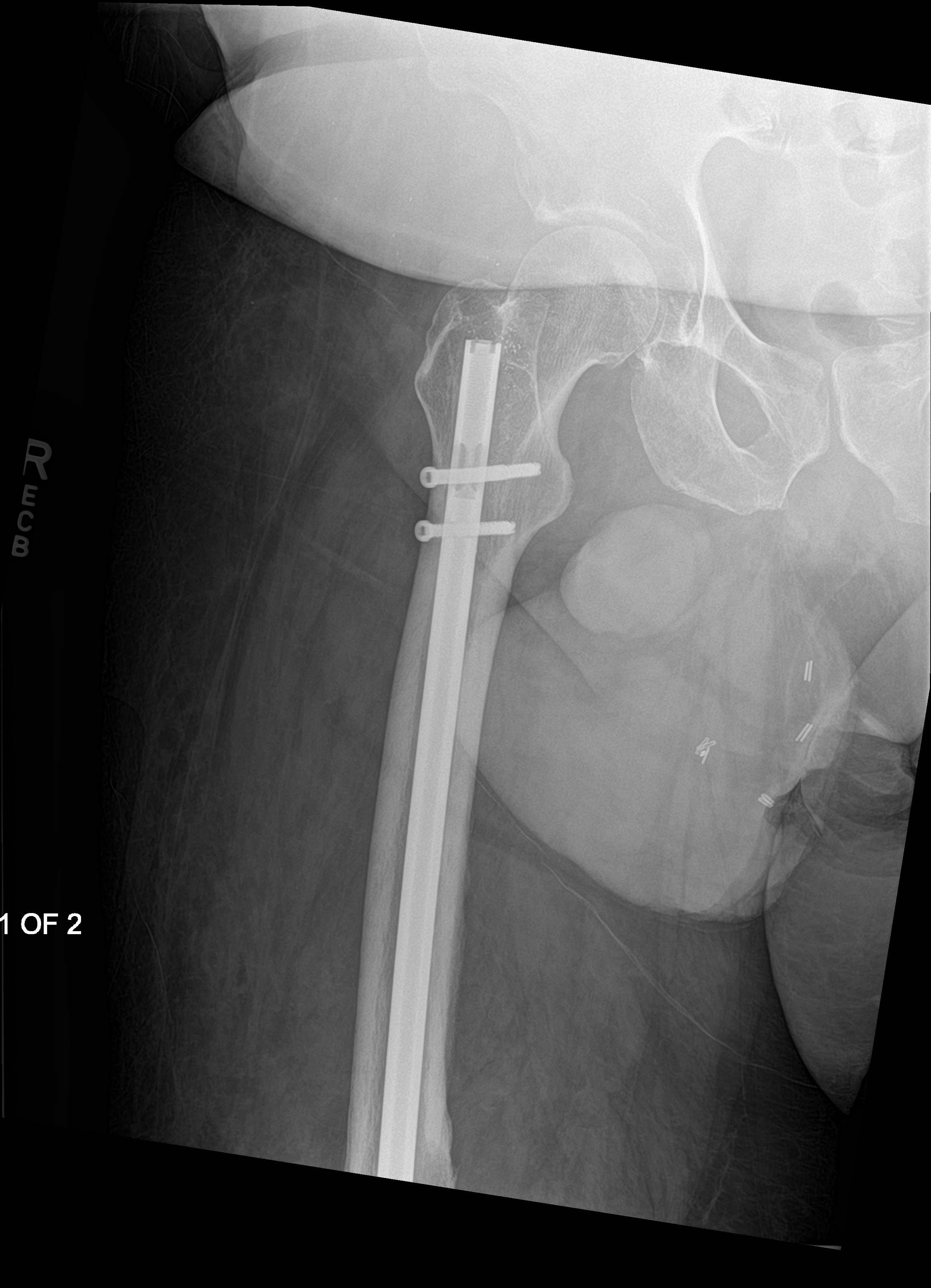

[femur lat (2 of 2)]
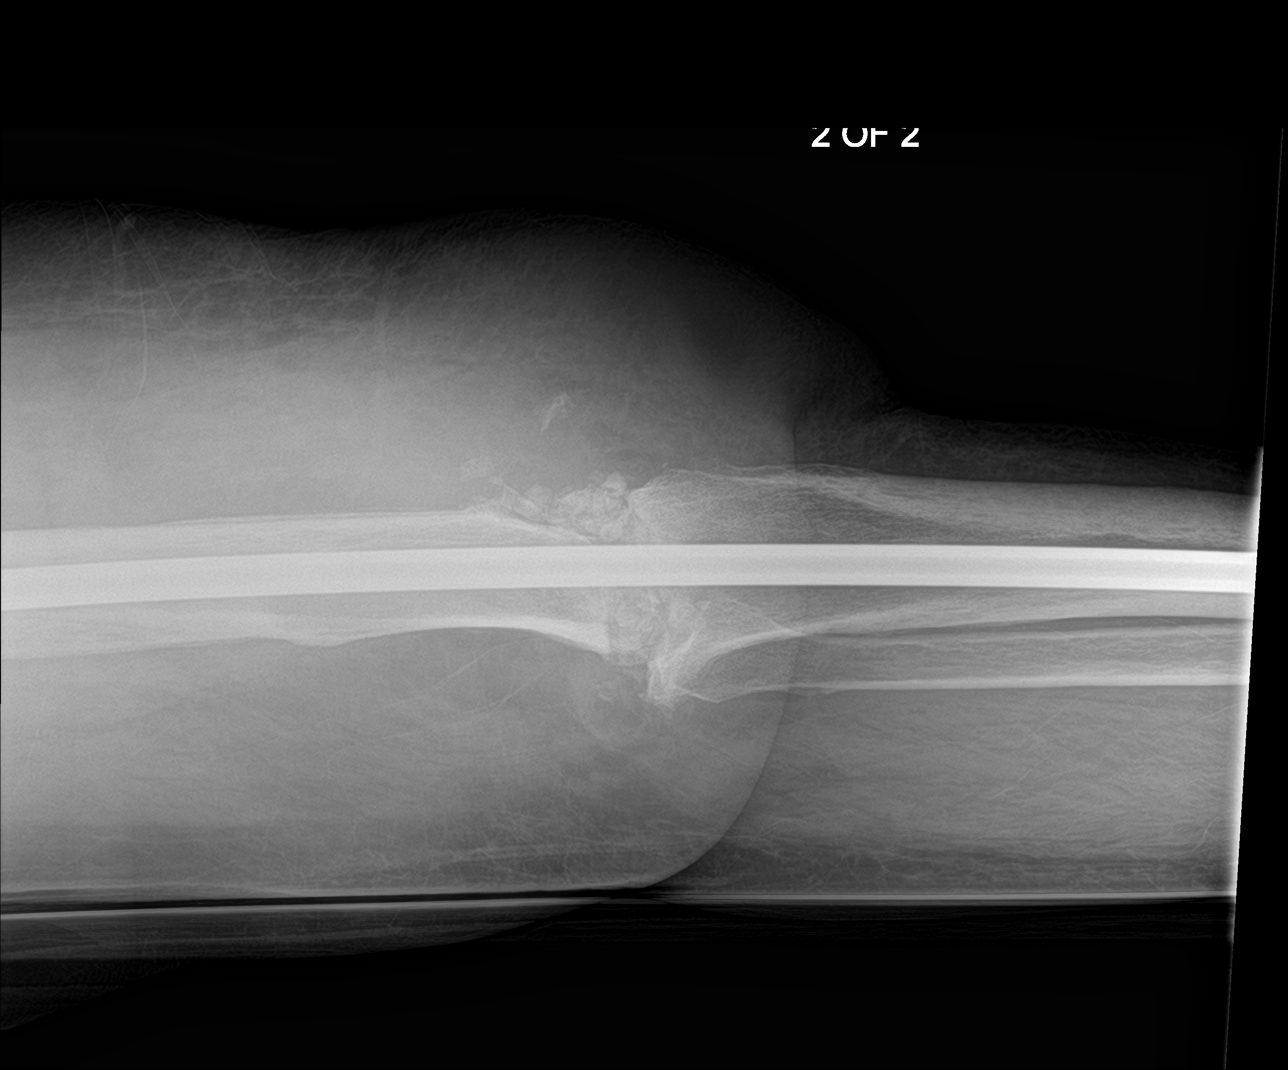

[4 of 4 positions shown; findings below may reference images not displayed]

FINDINGS: An intramedullary rod is in place from the right femur
intertrochanteric segment extending distally through the right knee
and into the tibia. There are 2 proximal interlocking cortical
screws. The proximal right femur and proximal hardware appear
intact. The right hip joint appears normal. Intact visible pelvis.

Chronic appearing destructive changes at the right knee joint with
apparent bone packing material, and surrounding dystrophic soft
tissue calcifications. No right knee arthrodesis suspected. No
suspicious lucency identified along the course of the intramedullary
rod through to the proximal tibia. No acute fracture identified.

No soft tissue gas identified.
IMPRESSION: 1. Intramedullary rod in place from the right femur
intertrochanteric segment distally into the right tibia appears
intact without evidence of loosening.
2. No right knee joint arthrodesis suspected. Chronic appearing bone
destruction about the knee with nonspecific dystrophic
calcifications.
